# Patient Record
Sex: Female | Born: 1955 | Race: Black or African American | Hispanic: No | Smoking: Never smoker
Health system: Southern US, Community
[De-identification: ages and names within clinical notes are randomized; demographics above are authoritative.]

## PROBLEM LIST (undated history)

## (undated) DIAGNOSIS — K219 Gastro-esophageal reflux disease without esophagitis: Secondary | ICD-10-CM

## (undated) DIAGNOSIS — M1990 Unspecified osteoarthritis, unspecified site: Secondary | ICD-10-CM

## (undated) DIAGNOSIS — I10 Essential (primary) hypertension: Secondary | ICD-10-CM

## (undated) DIAGNOSIS — E785 Hyperlipidemia, unspecified: Secondary | ICD-10-CM

## (undated) HISTORY — PX: POLYPECTOMY: SHX149

## (undated) HISTORY — DX: Hyperlipidemia, unspecified: E78.5

## (undated) HISTORY — PX: ABDOMINAL HYSTERECTOMY: SHX81

## (undated) HISTORY — PX: ROTATOR CUFF REPAIR: SHX139

## (undated) HISTORY — DX: Unspecified osteoarthritis, unspecified site: M19.90

## (undated) HISTORY — PX: COLONOSCOPY: SHX174

## (undated) HISTORY — DX: Gastro-esophageal reflux disease without esophagitis: K21.9

---

## 2000-08-12 ENCOUNTER — Ambulatory Visit (HOSPITAL_COMMUNITY): Admission: RE | Admit: 2000-08-12 | Discharge: 2000-08-12 | Payer: Self-pay | Admitting: Family Medicine

## 2000-08-12 ENCOUNTER — Encounter: Payer: Self-pay | Admitting: Family Medicine

## 2000-09-02 ENCOUNTER — Encounter: Admission: RE | Admit: 2000-09-02 | Discharge: 2000-09-02 | Payer: Self-pay | Admitting: Family Medicine

## 2000-09-02 ENCOUNTER — Encounter: Payer: Self-pay | Admitting: Family Medicine

## 2002-08-22 ENCOUNTER — Encounter: Admission: RE | Admit: 2002-08-22 | Discharge: 2002-08-22 | Payer: Self-pay | Admitting: Family Medicine

## 2002-08-22 ENCOUNTER — Encounter: Payer: Self-pay | Admitting: Family Medicine

## 2004-02-25 ENCOUNTER — Ambulatory Visit: Payer: Self-pay | Admitting: Family Medicine

## 2004-04-17 ENCOUNTER — Ambulatory Visit (HOSPITAL_COMMUNITY): Admission: RE | Admit: 2004-04-17 | Discharge: 2004-04-17 | Payer: Self-pay | Admitting: *Deleted

## 2004-06-10 ENCOUNTER — Ambulatory Visit: Payer: Self-pay | Admitting: Family Medicine

## 2004-06-16 ENCOUNTER — Ambulatory Visit: Payer: Self-pay | Admitting: Family Medicine

## 2004-06-17 ENCOUNTER — Ambulatory Visit: Payer: Self-pay | Admitting: Family Medicine

## 2004-06-24 ENCOUNTER — Ambulatory Visit: Payer: Self-pay | Admitting: Family Medicine

## 2004-07-31 ENCOUNTER — Ambulatory Visit: Payer: Self-pay | Admitting: Gastroenterology

## 2004-10-27 ENCOUNTER — Encounter: Admission: RE | Admit: 2004-10-27 | Discharge: 2004-10-27 | Payer: Self-pay | Admitting: Family Medicine

## 2005-08-13 ENCOUNTER — Ambulatory Visit (HOSPITAL_COMMUNITY): Admission: RE | Admit: 2005-08-13 | Discharge: 2005-08-14 | Payer: Self-pay | Admitting: Orthopedic Surgery

## 2006-06-29 ENCOUNTER — Emergency Department (HOSPITAL_COMMUNITY): Admission: EM | Admit: 2006-06-29 | Discharge: 2006-06-29 | Payer: Self-pay | Admitting: Emergency Medicine

## 2006-09-09 DIAGNOSIS — I10 Essential (primary) hypertension: Secondary | ICD-10-CM

## 2007-01-17 ENCOUNTER — Telehealth (INDEPENDENT_AMBULATORY_CARE_PROVIDER_SITE_OTHER): Payer: Self-pay | Admitting: *Deleted

## 2007-01-20 ENCOUNTER — Telehealth (INDEPENDENT_AMBULATORY_CARE_PROVIDER_SITE_OTHER): Payer: Self-pay | Admitting: *Deleted

## 2007-02-21 ENCOUNTER — Encounter: Admission: RE | Admit: 2007-02-21 | Discharge: 2007-02-21 | Payer: Self-pay | Admitting: Internal Medicine

## 2007-03-23 ENCOUNTER — Ambulatory Visit: Payer: Self-pay | Admitting: Internal Medicine

## 2007-03-24 ENCOUNTER — Ambulatory Visit: Payer: Self-pay | Admitting: *Deleted

## 2007-06-13 ENCOUNTER — Ambulatory Visit: Payer: Self-pay | Admitting: Internal Medicine

## 2007-06-13 ENCOUNTER — Encounter: Payer: Self-pay | Admitting: Family Medicine

## 2007-06-13 LAB — CONVERTED CEMR LAB
ALT: 18 units/L (ref 0–35)
AST: 14 units/L (ref 0–37)
Albumin: 4.2 g/dL (ref 3.5–5.2)
Basophils Absolute: 0 10*3/uL (ref 0.0–0.1)
Basophils Relative: 0 % (ref 0–1)
CO2: 26 meq/L (ref 19–32)
Calcium: 9.6 mg/dL (ref 8.4–10.5)
Chloride: 107 meq/L (ref 96–112)
Cholesterol: 228 mg/dL — ABNORMAL HIGH (ref 0–200)
Hemoglobin: 12.5 g/dL (ref 12.0–15.0)
Lymphocytes Relative: 24 % (ref 12–46)
MCHC: 31.5 g/dL (ref 30.0–36.0)
Monocytes Absolute: 0.3 10*3/uL (ref 0.1–1.0)
Monocytes Relative: 4 % (ref 3–12)
Neutro Abs: 4.8 10*3/uL (ref 1.7–7.7)
Neutrophils Relative %: 69 % (ref 43–77)
Potassium: 3.6 meq/L (ref 3.5–5.3)
RBC: 4.9 M/uL (ref 3.87–5.11)
RDW: 14 % (ref 11.5–15.5)
TSH: 0.343 microintl units/mL — ABNORMAL LOW (ref 0.350–5.50)

## 2007-06-20 ENCOUNTER — Ambulatory Visit: Payer: Self-pay | Admitting: Internal Medicine

## 2007-07-07 ENCOUNTER — Ambulatory Visit: Payer: Self-pay | Admitting: Internal Medicine

## 2007-09-29 ENCOUNTER — Emergency Department (HOSPITAL_COMMUNITY): Admission: EM | Admit: 2007-09-29 | Discharge: 2007-09-29 | Payer: Self-pay | Admitting: Emergency Medicine

## 2007-09-29 ENCOUNTER — Ambulatory Visit: Payer: Self-pay | Admitting: Internal Medicine

## 2007-10-29 ENCOUNTER — Ambulatory Visit (HOSPITAL_COMMUNITY): Admission: RE | Admit: 2007-10-29 | Discharge: 2007-10-29 | Payer: Self-pay | Admitting: Internal Medicine

## 2008-11-21 ENCOUNTER — Ambulatory Visit (HOSPITAL_COMMUNITY): Admission: RE | Admit: 2008-11-21 | Discharge: 2008-11-21 | Payer: Self-pay | Admitting: Family Medicine

## 2009-05-01 ENCOUNTER — Telehealth: Payer: Self-pay | Admitting: Family Medicine

## 2009-05-21 ENCOUNTER — Ambulatory Visit: Payer: Self-pay | Admitting: Family Medicine

## 2009-05-21 DIAGNOSIS — E782 Mixed hyperlipidemia: Secondary | ICD-10-CM

## 2010-02-02 LAB — CONVERTED CEMR LAB
ALT: 29 units/L (ref 0–35)
Albumin: 4 g/dL (ref 3.5–5.2)
BUN: 16 mg/dL (ref 6–23)
Bilirubin Urine: NEGATIVE
Bilirubin, Direct: 0.1 mg/dL (ref 0.0–0.3)
CO2: 32 meq/L (ref 19–32)
Calcium: 9.5 mg/dL (ref 8.4–10.5)
Cholesterol: 279 mg/dL — ABNORMAL HIGH (ref 0–200)
Glucose, Bld: 119 mg/dL — ABNORMAL HIGH (ref 70–99)
Glucose, Urine, Semiquant: NEGATIVE
Ketones, urine, test strip: NEGATIVE
Sodium: 142 meq/L (ref 135–145)
Specific Gravity, Urine: 1.025
Total Protein: 7.2 g/dL (ref 6.0–8.3)
Triglycerides: 343 mg/dL — ABNORMAL HIGH (ref 0.0–149.0)
pH: 6

## 2010-02-06 NOTE — Progress Notes (Signed)
Summary: refill  Phone Note Call from Patient   Summary of Call: patient would like a refill of atenolol.  she has made an appointment for 05/21/09 with dr Tais Koestner. Initial call taken by: Kern Reap CMA Duncan Dull),  May 01, 2009 11:29 AM    Prescriptions: ATENOLOL-CHLORTHALIDONE 50-25 MG  TABS (ATENOLOL-CHLORTHALIDONE) 1 tab qd  #30 x 0   Entered by:   Kern Reap CMA (AAMA)   Authorized by:   Roderick Pee MD   Signed by:   Kern Reap CMA (AAMA) on 05/01/2009   Method used:   Electronically to        [facility] (retail)       [address]       [place], [place]  [address]       Ph: [phone]       Fax: [phone]   RxID:   [id number]   Appended Document: refill sent to wrong pharamcy

## 2010-02-06 NOTE — Assessment & Plan Note (Signed)
Summary: RE-EST PT/RCD   Vital Signs:  Patient profile:   55 year old female Height:      64 inches Weight:      162 pounds BMI:     27.91 Temp:     98.7 degrees F oral BP sitting:   120 / 78  (left arm) Cuff size:   regular  Vitals Entered By: Kern Reap CMA Duncan Dull) (May 21, 2009 1:34 PM) CC: re-establish care Is Patient Diabetic? No   CC:  re-establish care.  History of Present Illness: Alexis Stout is a 55 year old single female, G1, P1, nonsmoker, who comes in today after a 4-year absence to renew her relationship . She has a history of underlying hypertension, treated with Tenoretic 50 /  25 daily.  BP 120/78.  she  has a history of hyperlipidemia.  She was taking simvastatin 20 mg nightly however, since she hasn't been here in 4 years.  She was not able to renew her prescription.  She's otherwise been in excellent health.  She gets routine eye care, and dental care, does BSE monthly.  Last mammogram September 2010.  She's never had a colonoscopy.  No family history of colon cancer.  Last tetanus booster 2000.  We will give her a booster today.  She works in the [employer].  She has no health insurance.    Allergies: No Known Drug Allergies  Past History:  Past medical, surgical, family and social histories (including risk factors) reviewed, and no changes noted (except as noted below).  Past Medical History: Reviewed history from 09/09/2006 and no changes required. Hypertension  Past Surgical History: Reviewed history from 09/09/2006 and no changes required. CB x1 TAH Foot Surgery Hysterectomy  Family History: Reviewed history from 09/09/2006 and no changes required. Family History Diabetes 1st degree relative Family History Hypertension Family History of Cardiovascular disorder Father: Deceased - heart disease Mother: Deceased - ovarian cancer  Social History: Reviewed history from 09/09/2006 and no changes required. Occupation:  Programmer, systems Single Never Smoked Alcohol use-yes Drug use-no  Physical Exam  General:  Well-developed,well-nourished,in no acute distress; alert,appropriate and cooperative throughout examination Head:  Normocephalic and atraumatic without obvious abnormalities. No apparent alopecia or balding. Eyes:  No corneal or conjunctival inflammation noted. EOMI. Perrla. Funduscopic exam benign, without hemorrhages, exudates or papilledema. Vision grossly normal. Ears:  External ear exam shows no significant lesions or deformities.  Otoscopic examination reveals clear canals, tympanic membranes are intact bilaterally without bulging, retraction, inflammation or discharge. Hearing is grossly normal bilaterally. Nose:  External nasal examination shows no deformity or inflammation. Nasal mucosa are pink and moist without lesions or exudates. Mouth:  Oral mucosa and oropharynx without lesions or exudates.  Teeth in good repair. Neck:  No deformities, masses, or tenderness noted. Chest Wall:  No deformities, masses, or tenderness noted. Breasts:  No mass, nodules, thickening, tenderness, bulging, retraction, inflamation, nipple discharge or skin changes noted.   Lungs:  Normal respiratory effort, chest expands symmetrically. Lungs are clear to auscultation, no crackles or wheezes. Heart:  Normal rate and regular rhythm. S1 and S2 normal without gallop, murmur, click, rub or other extra sounds. Abdomen:  Bowel sounds positive,abdomen soft and non-tender without masses, organomegaly or hernias noted. Rectal:  No external abnormalities noted. Normal sphincter tone. No rectal masses or tenderness. Genitalia:  Pelvic Exam:        External: normal female genitalia without lesions or masses        Vagina: normal without lesions or masses  Cervix: normal without lesions or masses        Adnexa: normal bimanual exam without masses or fullness        Uterus: normal by palpation        Pap smear: not  performed Msk:  No deformity or scoliosis noted of thoracic or lumbar spine.   Pulses:  R and L carotid,radial,femoral,dorsalis pedis and posterior tibial pulses are full and equal bilaterally Extremities:  No clubbing, cyanosis, edema, or deformity noted with normal full range of motion of all joints.   Neurologic:  No cranial nerve deficits noted. Station and gait are normal. Plantar reflexes are down-going bilaterally. DTRs are symmetrical throughout. Sensory, motor and coordinative functions appear intact. Skin:  Intact without suspicious lesions or rashes Cervical Nodes:  No lymphadenopathy noted Axillary Nodes:  No palpable lymphadenopathy Inguinal Nodes:  No significant adenopathy Psych:  Cognition and judgment appear intact. Alert and cooperative with normal attention span and concentration. No apparent delusions, illusions, hallucinations   Problems:  Medical Problems Added: 1)  Dx of Hyperlipidemia  (ICD-272.4)  Impression & Recommendations:  Problem # 1:  HYPERTENSION (ICD-401.9) Assessment Improved  The following medications were removed from the medication list:    Tenoretic 50 50-25 Mg Tabs (Atenolol-chlorthalidone) Her updated medication list for this problem includes:    Atenolol-chlorthalidone 50-25 Mg Tabs (Atenolol-chlorthalidone) .Marland Kitchen... 1 tab qd  Orders: Venipuncture (16109) TLB-Lipid Panel (80061-LIPID) TLB-BMP (Basic Metabolic Panel-BMET) (80048-METABOL) TLB-Hemoglobin (Hgb) (85018-HGB) TLB-Hepatic/Liver Function Pnl (80076-HEPATIC) Prescription Created Electronically [phone]) UA Dipstick w/o Micro (automated)  (81003) EKG w/ Interpretation (93000)  Problem # 2:  HYPERLIPIDEMIA (ICD-272.4) Assessment: New  The following medications were removed from the medication list:    Simvastatin 20 Mg Tabs (Simvastatin)    Lipitor 10 Mg Tabs (Atorvastatin calcium)    Zocor 10 Mg Tabs (Simvastatin) Her updated medication list for this problem includes:     Simvastatin 20 Mg Tabs (Simvastatin) .Marland Kitchen... Take one tab by mouth at bedtime  Orders: Venipuncture (09811) TLB-Lipid Panel (80061-LIPID) TLB-BMP (Basic Metabolic Panel-BMET) (80048-METABOL) TLB-Hemoglobin (Hgb) (85018-HGB) TLB-Hepatic/Liver Function Pnl (80076-HEPATIC) Prescription Created Electronically [phone]) UA Dipstick w/o Micro (automated)  (81003) EKG w/ Interpretation (93000)  Complete Medication List: 1)  Atenolol-chlorthalidone 50-25 Mg Tabs (Atenolol-chlorthalidone) .Marland Kitchen.. 1 tab qd 2)  Baby Aspirin 81 Mg Chew (Aspirin) 3)  Simvastatin 20 Mg Tabs (Simvastatin) .... Take one tab by mouth at bedtime 4)  Triamcinolone Acetonide 0.5 % Crea (Triamcinolone acetonide) .... Apply at bedtime  Other Orders: Tdap => 27yrs IM (29562) Admin 1st Vaccine (13086)  Patient Instructions: 1)  continue the Tenoretic 5025 one tablet daily. 2)  Takes Zocor 20 mg nightly along with an 81-mg baby aspirin. 3)  Returned May 2012..........,  annual exam. 4)  Call GI [phone] to see if there is any scholarship money available for a screening colonoscopy Prescriptions: TRIAMCINOLONE ACETONIDE 0.5 % CREA (TRIAMCINOLONE ACETONIDE) apply at bedtime  #60 gr x 2   Entered and Authorized by:   Roderick Pee MD   Signed by:   Roderick Pee MD on 05/21/2009   Method used:   Print then Give to Patient   RxID:   [id number] SIMVASTATIN 20 MG TABS (SIMVASTATIN) take one tab by mouth at bedtime  #100 x 3   Entered and Authorized by:   Roderick Pee MD   Signed by:   Roderick Pee MD on 05/21/2009   Method used:   Print then Give to Patient   RxID:   [id number]  ATENOLOL-CHLORTHALIDONE 50-25 MG  TABS (ATENOLOL-CHLORTHALIDONE) 1 tab qd  #100 x 3   Entered and Authorized by:   Roderick Pee MD   Signed by:   Roderick Pee MD on 05/21/2009   Method used:   Print then Give to Patient   RxID:   [id number]    Immunizations Administered:  Tetanus Vaccine:    Vaccine Type: Tdap    Site:  right deltoid    Mfr: GlaxoSmithKline    Dose: 0.5 ml    Route: IM    Given by: Kern Reap CMA (AAMA)    Exp. Date: 03/30/2011    Lot #: [id number]    Physician counseled: yes   Laboratory Results   Urine Tests    Routine Urinalysis   Color: yellow Appearance: Clear Glucose: negative   (Normal Range: Negative) Bilirubin: negative   (Normal Range: Negative) Ketone: negative   (Normal Range: Negative) Spec. Gravity: 1.025   (Normal Range: 1.003-1.035) Blood: trace-lysed   (Normal Range: Negative) pH: 6.0   (Normal Range: 5.0-8.0) Protein: negative   (Normal Range: Negative) Urobilinogen: 0.2   (Normal Range: 0-1) Nitrite: negative   (Normal Range: Negative) Leukocyte Esterace: trace   (Normal Range: Negative)    Comments: Rita Ohara  May 21, 2009 3:29 PM

## 2010-05-14 ENCOUNTER — Other Ambulatory Visit: Payer: Self-pay | Admitting: Family Medicine

## 2010-05-14 DIAGNOSIS — Z1231 Encounter for screening mammogram for malignant neoplasm of breast: Secondary | ICD-10-CM

## 2010-05-22 ENCOUNTER — Ambulatory Visit (HOSPITAL_COMMUNITY)
Admission: RE | Admit: 2010-05-22 | Discharge: 2010-05-22 | Disposition: A | Discharge status: Home / Self Care | Payer: Self-pay | Source: Ambulatory Visit | Attending: Family Medicine | Admitting: Family Medicine

## 2010-05-22 DIAGNOSIS — Z1231 Encounter for screening mammogram for malignant neoplasm of breast: Secondary | ICD-10-CM

## 2010-05-23 NOTE — Op Note (Signed)
Alexis Stout, Alexis Stout               ACCOUNT NO.:  [id number]   MEDICAL RECORD NO.:  [id number]          PATIENT TYPE:  AMB   LOCATION:  DAY                          FACILITY:  [facility]   PHYSICIAN:  Marlowe Kays, M.D.  DATE OF BIRTH:  [birth year]   DATE OF PROCEDURE:  08/13/2005  DATE OF DISCHARGE:                                 OPERATIVE REPORT   PREOPERATIVE DIAGNOSES:  1. Possible posterior labral tear.  2. 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   POSTOPERATIVE DIAGNOSES:  1. No labral tear seen.  2. 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   OPERATION:  Right shoulder arthroscopy (essentially normal examination and  arthroscopic subacromial decompression, right shoulder).   SURGEON:  Dr. Simonne Come.   ASSISTANT:  Mr. Idolina Primer P.A.-C.   ANESTHESIA:  General proceeded by interscalene block pathology.   PATHOLOGY AND INDICATION FOR PROCEDURE:  Painful right shoulder with A MRI  demonstrating diagnoses 1 and 2 in preop diagnosis.   PROCEDURE:  Satisfactory general anesthesia after the interscalene block,  beach-chair position on the [place] frame.  Right shoulder girdle was prepped  with DuraPrep, draped in a sterile field.  Anatomy of the shoulder joint was  marked out, and posterior soft spot portal, lateral portal and subacromial  space were all infiltrated with 1/2% Marcaine with adrenalin.  Through the  posterior soft spot portal, I atraumatically entered the glenohumeral joint.  On inspection, it was basically normal, some minimal fraying of the labrum  which did not require shaving.  I then redirected the scope in the  subacromial space, and through a lateral portal, I introduced the 4.2  shaver.  She had a marked subdeltoid bursitis which I began clearing out  with the shaver.  I then switched to the a 90-degree ArthroCare vaporizer  and began removing the soft tissue from the  undersurface of the acromion.  Early on in this process, I noted what appeared be either an abrasion or a  third degree burn inferior to the lateral portal.  This was addressed  subsequently as noted below.  Following removal of most of the soft tissue  and outlining the anterior bone formation which was quite prominent, I then  used a 4-mm oval bur and began burring down the undersurface of the  acromion.  I went back-and-forth between the 4.2 shaver, the ArthroCare  vaporizer and the bur until we had a wide decompression as documented with  pictures with the arm to side and the arm abducted.  There was no unusual  bleeding at the conclusion of the case.  We evacuated all fluid possible.  I  excised the area of abrasion/burn which measured 8 mm in width and 1 cm in  length, incorporating it into the lateral portal.  I infiltrated the two  portals and subacromial space once again with 1/2% Marcaine with adrenalin.  The posterior portal was closed with 4-0 nylon.  The lateral extended portal closed with 3-0 Vicryl subcutaneous tissue and 4-  0 nylon skin as  well.  Betadine, Adaptic dry dressing and shoulder  immobilizer applied.  She tolerated the procedure well and was taken to  recovery in satisfactory condition with no known complications other than  the abrasion/burn as described.           ______________________________  Marlowe Kays, M.D.     JA/MEDQ  D:  08/13/2005  T:  08/13/2005  Job:  [id number]

## 2010-09-26 ENCOUNTER — Other Ambulatory Visit: Payer: Self-pay | Admitting: Family Medicine

## 2011-01-12 ENCOUNTER — Ambulatory Visit (HOSPITAL_COMMUNITY)
Admission: RE | Admit: 2011-01-12 | Discharge: 2011-01-12 | Disposition: A | Discharge status: Home / Self Care | Payer: Self-pay | Source: Ambulatory Visit | Attending: Orthopaedic Surgery | Admitting: Orthopaedic Surgery

## 2011-01-12 DIAGNOSIS — M79609 Pain in unspecified limb: Secondary | ICD-10-CM | POA: Insufficient documentation

## 2011-01-12 DIAGNOSIS — M7989 Other specified soft tissue disorders: Secondary | ICD-10-CM

## 2011-01-12 DIAGNOSIS — R609 Edema, unspecified: Secondary | ICD-10-CM

## 2011-01-12 NOTE — Progress Notes (Signed)
Left lower extremity venous duplex completed.  Preliminary report is negative for DVT, SVT, or a Baker's cyst. Smiley Houseman 01/12/2011, 3:10 PM

## 2011-03-11 ENCOUNTER — Other Ambulatory Visit: Payer: Self-pay | Admitting: Family Medicine

## 2011-10-09 ENCOUNTER — Other Ambulatory Visit: Payer: Self-pay | Admitting: Family Medicine

## 2011-10-09 DIAGNOSIS — Z1231 Encounter for screening mammogram for malignant neoplasm of breast: Secondary | ICD-10-CM

## 2011-10-16 ENCOUNTER — Other Ambulatory Visit: Payer: Self-pay | Admitting: Family Medicine

## 2011-10-28 ENCOUNTER — Ambulatory Visit (HOSPITAL_COMMUNITY)
Admission: RE | Admit: 2011-10-28 | Discharge: 2011-10-28 | Disposition: A | Discharge status: Home / Self Care | Payer: Self-pay | Source: Ambulatory Visit | Attending: Family Medicine | Admitting: Family Medicine

## 2011-10-28 DIAGNOSIS — Z1231 Encounter for screening mammogram for malignant neoplasm of breast: Secondary | ICD-10-CM

## 2011-11-24 ENCOUNTER — Ambulatory Visit (INDEPENDENT_AMBULATORY_CARE_PROVIDER_SITE_OTHER): Payer: Self-pay | Admitting: Family Medicine

## 2011-11-24 ENCOUNTER — Encounter: Payer: Self-pay | Admitting: Family Medicine

## 2011-11-24 VITALS — BP 130/80 | Temp 98.0°F | Ht 64.0 in | Wt 163.0 lb

## 2011-11-24 DIAGNOSIS — I10 Essential (primary) hypertension: Secondary | ICD-10-CM

## 2011-11-24 NOTE — Progress Notes (Signed)
  Subjective:    Patient ID: Alexis Stout, female    DOB: 28-Jun-1955, 56 y.o.   MRN: [id number]  HPI Alexis Stout is a 56 year old married female nonsmoker who comes in today for followup of hypertension  We started on Tenoretic 50/25 BP today 130/80 pulse 70 and regular no side effects from medication  She also recently had a mammogram which was normal.  She needs to have a physical examination get set up for colonoscopy etc. however she has no insurance   Review of Systems General and cardiovascular review of systems otherwise negative    Objective:   Physical Exam Well-developed well-nourished female in no acute distress BP right arm sitting position 130/80 pulse 70 and regular       Assessment & Plan:  Hypertension at goal continue current therapy  Return for CPX ASAP

## 2011-11-24 NOTE — Patient Instructions (Signed)
Continue blood pressure medication one tablet daily  Return for CPX ASAP

## 2011-12-18 ENCOUNTER — Other Ambulatory Visit: Payer: Self-pay | Admitting: Internal Medicine

## 2012-02-01 ENCOUNTER — Other Ambulatory Visit: Payer: Self-pay | Admitting: Internal Medicine

## 2012-02-04 ENCOUNTER — Other Ambulatory Visit: Payer: Self-pay | Admitting: Family Medicine

## 2012-05-29 ENCOUNTER — Emergency Department (HOSPITAL_COMMUNITY)
Admission: EM | Admit: 2012-05-29 | Discharge: 2012-05-29 | Disposition: A | Discharge status: Home / Self Care | Payer: Self-pay | Attending: Emergency Medicine | Admitting: Emergency Medicine

## 2012-05-29 ENCOUNTER — Emergency Department (HOSPITAL_COMMUNITY): Payer: Self-pay

## 2012-05-29 ENCOUNTER — Encounter (HOSPITAL_COMMUNITY): Payer: Self-pay | Admitting: Emergency Medicine

## 2012-05-29 DIAGNOSIS — I10 Essential (primary) hypertension: Secondary | ICD-10-CM | POA: Insufficient documentation

## 2012-05-29 DIAGNOSIS — R51 Headache: Secondary | ICD-10-CM

## 2012-05-29 DIAGNOSIS — Z79899 Other long term (current) drug therapy: Secondary | ICD-10-CM | POA: Insufficient documentation

## 2012-05-29 HISTORY — DX: Essential (primary) hypertension: I10

## 2012-05-29 LAB — URINALYSIS, ROUTINE W REFLEX MICROSCOPIC
Bilirubin Urine: NEGATIVE
Glucose, UA: NEGATIVE mg/dL
Specific Gravity, Urine: 1.019 (ref 1.005–1.030)
pH: 5.5 (ref 5.0–8.0)

## 2012-05-29 LAB — CBC WITH DIFFERENTIAL/PLATELET
Basophils Absolute: 0 10*3/uL (ref 0.0–0.1)
Eosinophils Absolute: 0.2 10*3/uL (ref 0.0–0.7)
Eosinophils Relative: 2 % (ref 0–5)
Lymphocytes Relative: 17 % (ref 12–46)
MCH: 25.7 pg — ABNORMAL LOW (ref 26.0–34.0)
MCV: 76.8 fL — ABNORMAL LOW (ref 78.0–100.0)
Platelets: 252 10*3/uL (ref 150–400)
RDW: 12.8 % (ref 11.5–15.5)
WBC: 9.9 10*3/uL (ref 4.0–10.5)

## 2012-05-29 LAB — COMPREHENSIVE METABOLIC PANEL
ALT: 14 U/L (ref 0–35)
AST: 12 U/L (ref 0–37)
Calcium: 9.5 mg/dL (ref 8.4–10.5)
Sodium: 139 mEq/L (ref 135–145)
Total Protein: 7.8 g/dL (ref 6.0–8.3)

## 2012-05-29 MED ORDER — POTASSIUM CHLORIDE CRYS ER 20 MEQ PO TBCR
40.0000 meq | EXTENDED_RELEASE_TABLET | Freq: Once | ORAL | Status: AC
  Administered 2012-05-29: 40 meq via ORAL
  Filled 2012-05-29: qty 2

## 2012-05-29 MED ORDER — HYDROCODONE-ACETAMINOPHEN 5-325 MG PO TABS
1.0000 | ORAL_TABLET | Freq: Once | ORAL | Status: AC
  Administered 2012-05-29: 1 via ORAL
  Filled 2012-05-29: qty 1

## 2012-05-29 MED ORDER — AMOXICILLIN 500 MG PO CAPS: 500.0000 mg | ORAL_CAPSULE | Freq: Three times a day (TID) | ORAL | Status: DC

## 2012-05-29 MED ORDER — TRAMADOL HCL 50 MG PO TABS: 50.0000 mg | ORAL_TABLET | Freq: Four times a day (QID) | ORAL | Status: DC | PRN

## 2012-05-29 NOTE — ED Notes (Signed)
Dr. Estell Harpin in to speak w/ pt regarding pt's concern about not having a CT ordered. Order placed per Dr. Estell Harpin

## 2012-05-29 NOTE — ED Provider Notes (Addendum)
History     CSN: [id number]  Arrival date & time 05/29/12  0906   First MD Initiated Contact with Patient 05/29/12 [phone]      Chief Complaint  Patient presents with  . Headache    (Consider location/radiation/quality/duration/timing/severity/associated sxs/prior treatment) Patient is a 57 y.o. female presenting with headaches. The history is provided by the patient (the pt complains of a mild headache). No language interpreter was used.  Headache Pain location:  Frontal Quality:  Dull Radiates to:  Does not radiate Severity currently:  4/10 Severity at highest:  6/10 Onset quality:  Gradual Timing:  Intermittent Progression:  Waxing and waning Chronicity:  New Associated symptoms: no abdominal pain, no back pain, no congestion, no cough, no diarrhea, no fatigue, no seizures and no sinus pressure     Past Medical History  Diagnosis Date  . Hypertension     Past Surgical History  Procedure Laterality Date  . Abdominal hysterectomy      No family history on file.  History  Substance Use Topics  . Smoking status: Never Smoker   . Smokeless tobacco: Never Used  . Alcohol Use: 0.0 oz/week    1-2 Glasses of wine per week     Comment: 1-2 glasses every after noon    OB History   Grav Para Term Preterm Abortions TAB SAB Ect Mult Living                  Review of Systems  Constitutional: Negative for appetite change and fatigue.  HENT: Negative for congestion, sinus pressure and ear discharge.   Eyes: Negative for discharge.  Respiratory: Negative for cough.   Cardiovascular: Negative for chest pain.  Gastrointestinal: Negative for abdominal pain and diarrhea.  Genitourinary: Negative for frequency and hematuria.  Musculoskeletal: Negative for back pain.  Skin: Negative for rash.  Neurological: Positive for headaches. Negative for seizures.  Psychiatric/Behavioral: Negative for hallucinations.    Allergies  Review of patient's allergies indicates no known  allergies.  Home Medications   Current Outpatient Rx  Name  Route  Sig  Dispense  Refill  . atenolol-chlorthalidone (TENORETIC) 50-25 MG per tablet      TAKE ONE TABLET BY MOUTH EVERY DAY. MUST BE SEEN BEFORE ADDITONAL REFILLS   30 tablet   0   . ibuprofen (ADVIL,MOTRIN) 200 MG tablet   Oral   Take 400 mg by mouth every 6 (six) hours as needed for pain.         . traMADol (ULTRAM) 50 MG tablet   Oral   Take 1 tablet (50 mg total) by mouth every 6 (six) hours as needed for pain.   15 tablet   0     BP 134/80  Pulse 67  Temp(Src) 98.2 F (36.8 C) (Oral)  Resp 20  SpO2 99%  Physical Exam  Constitutional: She is oriented to person, place, and time. She appears well-developed.  HENT:  Head: Normocephalic.  Eyes: Conjunctivae and EOM are normal. No scleral icterus.  Neck: Neck supple. No thyromegaly present.  Cardiovascular: Normal rate and regular rhythm.  Exam reveals no gallop and no friction rub.   No murmur heard. Pulmonary/Chest: No stridor. She has no wheezes. She has no rales. She exhibits no tenderness.  Abdominal: She exhibits no distension. There is no tenderness. There is no rebound.  Musculoskeletal: Normal range of motion. She exhibits no edema.  Lymphadenopathy:    She has no cervical adenopathy.  Neurological: She is oriented to person, place,  and time. Coordination normal.  Skin: No rash noted. No erythema.  Psychiatric: She has a normal mood and affect. Her behavior is normal.    ED Course  Procedures (including critical care time)  Labs Reviewed  CBC WITH DIFFERENTIAL - Abnormal; Notable for the following:    MCV 76.8 (*)    MCH 25.7 (*)    All other components within normal limits  COMPREHENSIVE METABOLIC PANEL - Abnormal; Notable for the following:    Potassium 3.1 (*)    Glucose, Bld 113 (*)    Alkaline Phosphatase 119 (*)    GFR calc non Af Amer 74 (*)    GFR calc Af Amer 86 (*)    All other components within normal limits   URINALYSIS, ROUTINE W REFLEX MICROSCOPIC - Abnormal; Notable for the following:    Hgb urine dipstick SMALL (*)    Leukocytes, UA TRACE (*)    All other components within normal limits  URINE MICROSCOPIC-ADD ON   No results found.   1. Headache       MDM  Headache tension,  Possible sinusitis,  Will tx with amox        Benny Lennert, MD 05/29/12 1132  Benny Lennert, MD 05/29/12 1251

## 2012-05-29 NOTE — ED Notes (Signed)
Pt presents with frontal headache, causing pressure behind eyes, pain is increased when pt is chewing food. When pt clear her throat it makes it hurt worse. Denies ear pain or sore throat. Pt also endorses dizziness with movement especially if she needs to bend over. Has taken some Ibuprofen with only some relief.  Pt does states on Thursday afternoon she had episode when she was unable to void.

## 2012-05-29 NOTE — ED Notes (Signed)
Discharge instructions reviewed w/ pt., verbalizes understanding. Two prescriptions provided at discharge. 

## 2012-05-29 NOTE — ED Notes (Signed)
Pt aware of the need for a urine sample. 

## 2012-05-31 ENCOUNTER — Encounter (HOSPITAL_COMMUNITY): Payer: Self-pay | Admitting: Emergency Medicine

## 2012-05-31 ENCOUNTER — Emergency Department (HOSPITAL_COMMUNITY)
Admission: EM | Admit: 2012-05-31 | Discharge: 2012-05-31 | Disposition: A | Discharge status: Home / Self Care | Payer: Self-pay | Attending: Emergency Medicine | Admitting: Emergency Medicine

## 2012-05-31 DIAGNOSIS — I10 Essential (primary) hypertension: Secondary | ICD-10-CM | POA: Insufficient documentation

## 2012-05-31 DIAGNOSIS — R51 Headache: Secondary | ICD-10-CM | POA: Insufficient documentation

## 2012-05-31 MED ORDER — KETOROLAC TROMETHAMINE 30 MG/ML IJ SOLN
30.0000 mg | Freq: Once | INTRAMUSCULAR | Status: AC
  Administered 2012-05-31: 30 mg via INTRAVENOUS
  Filled 2012-05-31: qty 1

## 2012-05-31 MED ORDER — SODIUM CHLORIDE 0.9 % IV SOLN
INTRAVENOUS | Status: DC
  Administered 2012-05-31: 21:00:00 via INTRAVENOUS

## 2012-05-31 MED ORDER — DEXAMETHASONE SODIUM PHOSPHATE 10 MG/ML IJ SOLN
10.0000 mg | Freq: Once | INTRAMUSCULAR | Status: AC
  Administered 2012-05-31: 10 mg via INTRAVENOUS
  Filled 2012-05-31: qty 1

## 2012-05-31 MED ORDER — NAPROXEN 500 MG PO TABS: 500.0000 mg | ORAL_TABLET | Freq: Two times a day (BID) | ORAL | Status: DC

## 2012-05-31 MED ORDER — DIPHENHYDRAMINE HCL 50 MG/ML IJ SOLN
25.0000 mg | Freq: Once | INTRAMUSCULAR | Status: AC
  Administered 2012-05-31: 25 mg via INTRAVENOUS
  Filled 2012-05-31: qty 1

## 2012-05-31 MED ORDER — METOCLOPRAMIDE HCL 5 MG/ML IJ SOLN
10.0000 mg | Freq: Once | INTRAMUSCULAR | Status: AC
  Administered 2012-05-31: 10 mg via INTRAVENOUS
  Filled 2012-05-31: qty 2

## 2012-05-31 NOTE — ED Provider Notes (Signed)
History     CSN: [id number]  Arrival date & time 05/31/12  1811   First MD Initiated Contact with Patient 05/31/12 2007      Chief Complaint  Patient presents with  . Headache    (Consider location/radiation/quality/duration/timing/severity/associated sxs/prior treatment) HPI Alexis Stout is a 57 y.o. female with a history of hypertension presents emergency department complaining of headache.  Patient was seen on Sunday for same thing and was treated for possible sinus headache with tramadol and amoxicillin.  At that time patient had a CT head that was negative for acute infiltrate.  Patient states headache onset began Thursday and is described as a throbbing sensation in the frontal lobe area.  Patient denies any vision changes, vomiting, head trauma, ataxia, weakness or slurred speech.  Past Medical History  Diagnosis Date  . Hypertension     Past Surgical History  Procedure Laterality Date  . Abdominal hysterectomy      No family history on file.  History  Substance Use Topics  . Smoking status: Never Smoker   . Smokeless tobacco: Never Used  . Alcohol Use: 0.0 oz/week    1-2 Glasses of wine per week     Comment: 1-2 glasses every after noon    OB History   Grav Para Term Preterm Abortions TAB SAB Ect Mult Living                  Review of Systems Ten systems reviewed and are negative for acute change, except as noted in the HPI.    Allergies  Review of patient's allergies indicates no known allergies.  Home Medications   Current Outpatient Rx  Name  Route  Sig  Dispense  Refill  . amoxicillin (AMOXIL) 500 MG capsule   Oral   Take 500 mg by mouth 3 (three) times daily.         Marland Kitchen atenolol-chlorthalidone (TENORETIC) 50-25 MG per tablet   Oral   Take 1 tablet by mouth daily.         . traMADol (ULTRAM) 50 MG tablet   Oral   Take 50 mg by mouth every 6 (six) hours as needed for pain.         . naproxen (NAPROSYN) 500 MG tablet   Oral    Take 1 tablet (500 mg total) by mouth 2 (two) times daily.   30 tablet   0     BP 126/74  Pulse 73  Temp(Src) 99.2 F (37.3 C) (Oral)  SpO2 99%  Physical Exam  Nursing note and vitals reviewed. Constitutional: She is oriented to person, place, and time. She appears well-developed and well-nourished. She appears distressed.  HENT:  Head: Normocephalic and atraumatic.  Eyes: Conjunctivae and EOM are normal. Pupils are equal, round, and reactive to light. No scleral icterus.  Neck: Normal range of motion.  Neck supple with no nuchal rigidity, full pain free ROM. No carotid bruit  Cardiovascular: Normal rate, regular rhythm, normal heart sounds and intact distal pulses.   Pulmonary/Chest: Effort normal and breath sounds normal. No respiratory distress.  Musculoskeletal: Normal range of motion.  Neurological: She is alert and oriented to person, place, and time. She has normal strength.  CN III-XII intact, good coordination, normal gait, strength 5/5 bilaterally, intact distal sensation.  Skin: Skin is warm and dry.  No rash, non diaphoretic    ED Course  Procedures (including critical care time)  Labs Reviewed - No data to display No results found.  Medications  ketorolac (TORADOL) 30 MG/ML injection 30 mg (30 mg Intravenous Given 05/31/12 2036)  metoCLOPramide (REGLAN) injection 10 mg (10 mg Intravenous Given 05/31/12 2034)  diphenhydrAMINE (BENADRYL) injection 25 mg (25 mg Intravenous Given 05/31/12 2035)  dexamethasone (DECADRON) injection 10 mg (10 mg Intravenous Given 05/31/12 2118)     1. HA (headache)       MDM  HA Pt HA treated and improved while in ED.  Presentation is like pts typical HA and non concerning for [facility], ICH, Meningitis, or temporal arteritis. Pt is afebrile with no focal neuro deficits, nuchal rigidity, or change in vision. Pt is to follow up with PCP to discuss prophylactic medication. Pt verbalizes understanding and is agreeable with plan to dc.           Jaci Carrel, [place] 06/01/12 [phone]

## 2012-05-31 NOTE — ED Notes (Signed)
Pt c/o headache since Sunday. Pt states she was seen in ED on Sunday for the same thing, but is still having a headache. Pt denies n/v. States she is taking Tramadol and amoxicillin. States Tramadol not helping for pain. Pt ambulatory in triage with steady gait. Pt arrives with family member.

## 2012-06-02 NOTE — ED Provider Notes (Signed)
Medical screening examination/treatment/procedure(s) were performed by non-physician practitioner and as supervising physician I was immediately available for consultation/collaboration.   Loren Racer, MD 06/02/12 [phone]

## 2012-11-17 ENCOUNTER — Other Ambulatory Visit: Payer: Self-pay | Admitting: Family Medicine

## 2012-11-17 DIAGNOSIS — Z1231 Encounter for screening mammogram for malignant neoplasm of breast: Secondary | ICD-10-CM

## 2012-12-07 ENCOUNTER — Ambulatory Visit (HOSPITAL_COMMUNITY): Payer: Self-pay | Attending: Family Medicine

## 2012-12-28 ENCOUNTER — Ambulatory Visit (HOSPITAL_COMMUNITY)
Admission: RE | Admit: 2012-12-28 | Discharge: 2012-12-28 | Disposition: A | Discharge status: Home / Self Care | Payer: Self-pay | Source: Ambulatory Visit | Attending: Family Medicine | Admitting: Family Medicine

## 2012-12-28 DIAGNOSIS — Z1231 Encounter for screening mammogram for malignant neoplasm of breast: Secondary | ICD-10-CM

## 2013-01-14 ENCOUNTER — Emergency Department (HOSPITAL_COMMUNITY)
Admission: EM | Admit: 2013-01-14 | Discharge: 2013-01-14 | Disposition: A | Discharge status: Home / Self Care | Payer: No Typology Code available for payment source | Attending: Emergency Medicine | Admitting: Emergency Medicine

## 2013-01-14 ENCOUNTER — Encounter (HOSPITAL_COMMUNITY): Payer: Self-pay | Admitting: Emergency Medicine

## 2013-01-14 DIAGNOSIS — Z79899 Other long term (current) drug therapy: Secondary | ICD-10-CM | POA: Insufficient documentation

## 2013-01-14 DIAGNOSIS — R609 Edema, unspecified: Secondary | ICD-10-CM | POA: Insufficient documentation

## 2013-01-14 DIAGNOSIS — M25562 Pain in left knee: Secondary | ICD-10-CM

## 2013-01-14 DIAGNOSIS — M25569 Pain in unspecified knee: Secondary | ICD-10-CM | POA: Insufficient documentation

## 2013-01-14 DIAGNOSIS — I10 Essential (primary) hypertension: Secondary | ICD-10-CM | POA: Insufficient documentation

## 2013-01-14 MED ORDER — OXYCODONE-ACETAMINOPHEN 5-325 MG PO TABS: 1.0000 | ORAL_TABLET | ORAL | Status: DC | PRN

## 2013-01-14 NOTE — ED Notes (Signed)
States that she began having minor left knee pain yesterday accompanied by right hip pain that is worse this am

## 2013-01-14 NOTE — Discharge Instructions (Signed)
Take the prescribed medication as directed.  Do not drive while taking this medication. Follow-up with orthopedics if no improvement of symptoms within the next. Return to the ED for new or worsening symptoms.

## 2013-01-14 NOTE — ED Notes (Signed)
Lisa, PA at bedside

## 2013-01-14 NOTE — ED Provider Notes (Signed)
CSN: [id number]     Arrival date & time 01/14/13  0815 History   First MD Initiated Contact with Patient 01/14/13 [phone]     Chief Complaint  Patient presents with  . Knee Pain   (Consider location/radiation/quality/duration/timing/severity/associated sxs/prior Treatment) The history is provided by the patient and medical records.   This is a 58 year old female with past medical history significant for hypertension, presenting to the ED for intermittent left knee pain over the past several days. States pain initially started 3 days ago and upon waking, but improved throughout the day. Her pain has waxed and waned several times since then but has been constant this morning. Patient denies any recent injury, trauma, or falls. No prior left knee surgery. Denies any numbness or paresthesias of lower extremities. Patient has taken over-the-counter Motrin and Tylenol as well as wrapping her knee with an Ace wrap without noted improvement.  Pt states she has been limping, favoring her left knee which she thinks is now causing her right hip to hurt.  Pt states she is concerned because her friend who is a nurse told her it might be a DVT.  Pt has no prior hx of DVT or PE.  Past Medical History  Diagnosis Date  . Hypertension    Past Surgical History  Procedure Laterality Date  . Abdominal hysterectomy     No family history on file. History  Substance Use Topics  . Smoking status: Never Smoker   . Smokeless tobacco: Never Used  . Alcohol Use: 0.0 oz/week    1-2 Glasses of wine per week     Comment: 1-2 glasses every after noon   OB History   Grav Para Term Preterm Abortions TAB SAB Ect Mult Living                 Review of Systems  Musculoskeletal: Positive for arthralgias.  All other systems reviewed and are negative.    Allergies  Review of patient's allergies indicates no known allergies.  Home Medications   Current Outpatient Rx  Name  Route  Sig  Dispense  Refill  .  acetaminophen (TYLENOL) 500 MG tablet   Oral   Take 1,000 mg by mouth every 6 (six) hours as needed for moderate pain.         Marland Kitchen atenolol-chlorthalidone (TENORETIC) 50-25 MG per tablet   Oral   Take 1 tablet by mouth daily.         . traMADol (ULTRAM) 50 MG tablet   Oral   Take 50 mg by mouth every 6 (six) hours as needed for pain.          BP 136/77  Pulse 90  Temp(Src) 97.5 F (36.4 C) (Oral)  Resp 16  SpO2 99%  Physical Exam  Nursing note and vitals reviewed. Constitutional: She is oriented to person, place, and time. She appears well-developed and well-nourished. No distress.  HENT:  Head: Normocephalic and atraumatic.  Mouth/Throat: Oropharynx is clear and moist.  Eyes: Conjunctivae and EOM are normal. Pupils are equal, round, and reactive to light.  Neck: Normal range of motion. Neck supple.  Cardiovascular: Normal rate, regular rhythm and normal heart sounds.   Pulmonary/Chest: Effort normal and breath sounds normal. No respiratory distress. She has no wheezes.  Musculoskeletal: Normal range of motion.       Right hip: Normal.       Left knee: She exhibits swelling. She exhibits normal range of motion, no effusion, no ecchymosis, no deformity, no  laceration, no erythema, normal alignment and no LCL laxity. Tenderness found. Medial joint line tenderness noted.  Left knee with TTP along medial joint line; mild swelling but no bruising or deformity; full ROM maintained with some crepitus noted; no calf asymmetry, erythema, or palpable cord; negative Homan's sign Right hip with full ROM; no visible injuries or deformities  Neurological: She is alert and oriented to person, place, and time.  Skin: Skin is warm and dry. She is not diaphoretic.  Psychiatric: She has a normal mood and affect.    ED Course  Procedures (including critical care time) Labs Review Labs Reviewed - No data to display Imaging Review No results found.  EKG Interpretation   None        MDM   1. Knee pain, acute, left    Atraumatic left knee pain.  Sx and PE consistent with osteoarthritis, doubt DVT as pt has no calf pain, asymmetry, erythema, or TTP.  Do not feel that imaging will change plan of care.  Pt placed in knee sleeve.  Rx percocet.  FU with orthopedics if no improvement.  Discussed plan with pt, they agreed.  Return precautions advised.  Larene Pickett, PA-C 01/14/13 1105

## 2013-01-14 NOTE — ED Notes (Signed)
Pt escorted to discharge window. Verbalized understanding discharge instructions. In no acute distress.   

## 2013-01-14 NOTE — ED Provider Notes (Signed)
Medical screening examination/treatment/procedure(s) were performed by non-physician practitioner and as supervising physician I was immediately available for consultation/collaboration.  EKG Interpretation   None         Blanchard Kelch, MD 01/14/13 1601

## 2013-07-03 ENCOUNTER — Emergency Department (HOSPITAL_COMMUNITY)
Admission: EM | Admit: 2013-07-03 | Discharge: 2013-07-03 | Disposition: A | Discharge status: Home / Self Care | Payer: No Typology Code available for payment source | Source: Home / Self Care | Attending: Family Medicine | Admitting: Family Medicine

## 2013-07-03 ENCOUNTER — Other Ambulatory Visit (HOSPITAL_COMMUNITY)
Admission: RE | Admit: 2013-07-03 | Discharge: 2013-07-03 | Disposition: A | Discharge status: Home / Self Care | Payer: No Typology Code available for payment source | Source: Ambulatory Visit | Attending: Family Medicine | Admitting: Family Medicine

## 2013-07-03 ENCOUNTER — Encounter (HOSPITAL_COMMUNITY): Payer: Self-pay | Admitting: Emergency Medicine

## 2013-07-03 ENCOUNTER — Ambulatory Visit (HOSPITAL_COMMUNITY): Payer: No Typology Code available for payment source | Attending: Family Medicine

## 2013-07-03 DIAGNOSIS — R1032 Left lower quadrant pain: Secondary | ICD-10-CM | POA: Insufficient documentation

## 2013-07-03 DIAGNOSIS — Z90710 Acquired absence of both cervix and uterus: Secondary | ICD-10-CM | POA: Insufficient documentation

## 2013-07-03 DIAGNOSIS — N760 Acute vaginitis: Secondary | ICD-10-CM | POA: Insufficient documentation

## 2013-07-03 DIAGNOSIS — Z113 Encounter for screening for infections with a predominantly sexual mode of transmission: Secondary | ICD-10-CM | POA: Insufficient documentation

## 2013-07-03 DIAGNOSIS — R109 Unspecified abdominal pain: Secondary | ICD-10-CM

## 2013-07-03 LAB — COMPREHENSIVE METABOLIC PANEL
ALBUMIN: 3.8 g/dL (ref 3.5–5.2)
ALK PHOS: 99 U/L (ref 39–117)
ALT: 18 U/L (ref 0–35)
AST: 13 U/L (ref 0–37)
BUN: 12 mg/dL (ref 6–23)
CALCIUM: 9.5 mg/dL (ref 8.4–10.5)
CO2: 28 mEq/L (ref 19–32)
Chloride: 100 mEq/L (ref 96–112)
Creatinine, Ser: 0.77 mg/dL (ref 0.50–1.10)
GFR calc Af Amer: >90 mL/min (ref >90)
GFR calc non Af Amer: >90 mL/min (ref >90)
Glucose, Bld: 91 mg/dL (ref 70–99)
POTASSIUM: 3.7 meq/L (ref 3.7–5.3)
SODIUM: 141 meq/L (ref 137–147)
TOTAL PROTEIN: 7.7 g/dL (ref 6.0–8.3)
Total Bilirubin: 0.2 mg/dL — ABNORMAL LOW (ref 0.3–1.2)

## 2013-07-03 LAB — CBC
HCT: 38.8 % (ref 36.0–46.0)
Hemoglobin: 12.7 g/dL (ref 12.0–15.0)
MCH: 25.6 pg — ABNORMAL LOW (ref 26.0–34.0)
MCHC: 32.7 g/dL (ref 30.0–36.0)
MCV: 78.1 fL (ref 78.0–100.0)
Platelets: 219 10*3/uL (ref 150–400)
RBC: 4.97 MIL/uL (ref 3.87–5.11)
RDW: 13.2 % (ref 11.5–15.5)
WBC: 7.7 10*3/uL (ref 4.0–10.5)

## 2013-07-03 LAB — POCT URINALYSIS DIP (DEVICE)
BILIRUBIN URINE: NEGATIVE
GLUCOSE, UA: NEGATIVE mg/dL
KETONES UR: NEGATIVE mg/dL
LEUKOCYTES UA: NEGATIVE
NITRITE: NEGATIVE
PH: 5 (ref 5.0–8.0)
PROTEIN: NEGATIVE mg/dL
SPECIFIC GRAVITY, URINE: 1.02 (ref 1.005–1.030)
Urobilinogen, UA: 0.2 mg/dL (ref 0.0–1.0)

## 2013-07-03 MED ORDER — CEPHALEXIN 500 MG PO CAPS: 500.0000 mg | ORAL_CAPSULE | Freq: Three times a day (TID) | ORAL | Status: DC

## 2013-07-03 MED ORDER — TRAMADOL HCL 50 MG PO TABS: 50.0000 mg | ORAL_TABLET | Freq: Four times a day (QID) | ORAL | Status: DC | PRN

## 2013-07-03 NOTE — ED Provider Notes (Signed)
Alexis Stout is a 58 y.o. female who presents to Urgent Care today for left lower corner and abdominal/pelvic pain. Symptoms present for 6 days without injury. No fevers chills nausea vomiting or diarrhea. Patient notes mild urinary frequency. She denies any urgency or burning with pain. The pain is worse with activity and better with rest. She denies any diarrhea or vomiting. Her last bowel movement was today. She tried old leftover Percocet which helps some.  Patient has a surgical history for hysterectomy. Past Medical History  Diagnosis Date  . Hypertension    History  Substance Use Topics  . Smoking status: Never Smoker   . Smokeless tobacco: Never Used  . Alcohol Use: 0.0 oz/week    1-2 Glasses of wine per week     Comment: 1-2 glasses every after noon   ROS as above Medications: No current facility-administered medications for this encounter.   Current Outpatient Prescriptions  Medication Sig Dispense Refill  . atenolol-chlorthalidone (TENORETIC) 50-25 MG per tablet Take 1 tablet by mouth daily.      Marland Kitchen acetaminophen (TYLENOL) 500 MG tablet Take 1,000 mg by mouth every 6 (six) hours as needed for moderate pain.      . cephALEXin (KEFLEX) 500 MG capsule Take 1 capsule (500 mg total) by mouth 3 (three) times daily.  21 capsule  0  . traMADol (ULTRAM) 50 MG tablet Take 1 tablet (50 mg total) by mouth every 6 (six) hours as needed.  15 tablet  0    Exam:  BP 153/73  Pulse 56  Temp(Src) 98.3 F (36.8 C) (Oral)  Resp 18  SpO2 98% Gen: Well NAD HEENT: EOMI,  MMM Lungs: Normal work of breathing. CTABL Heart: RRR no MRG Abd: NABS, Soft. Mildly tender left lower quadrant, ND no rebound or guarding. No CV angle tenderness to percussion Exts: Brisk capillary refill, warm and well perfused.  Pelvis: Normal external genitalia. Vaginal canal with thin white discharge. Vaginal cuff is normal-appearing. No adnexal masses or significant pain Hips: Nontender full range of motion  bilaterally   Results for orders placed during the hospital encounter of 07/03/13 (from the past 24 hour(s))  POCT URINALYSIS DIP (DEVICE)     Status: Abnormal   Collection Time    07/03/13 12:28 PM      Result Value Ref Range   Glucose, UA NEGATIVE  NEGATIVE mg/dL   Bilirubin Urine NEGATIVE  NEGATIVE   Ketones, ur NEGATIVE  NEGATIVE mg/dL   Specific Gravity, Urine 1.020  1.005 - 1.030   Hgb urine dipstick SMALL (*) NEGATIVE   pH 5.0  5.0 - 8.0   Protein, ur NEGATIVE  NEGATIVE mg/dL   Urobilinogen, UA 0.2  0.0 - 1.0 mg/dL   Nitrite NEGATIVE  NEGATIVE   Leukocytes, UA NEGATIVE  NEGATIVE  CBC     Status: Abnormal   Collection Time    07/03/13 12:51 PM      Result Value Ref Range   WBC 7.7  4.0 - 10.5 K/uL   RBC 4.97  3.87 - 5.11 MIL/uL   Hemoglobin 12.7  12.0 - 15.0 g/dL   HCT 38.8  36.0 - 46.0 %   MCV 78.1  78.0 - 100.0 fL   MCH 25.6 (*) 26.0 - 34.0 pg   MCHC 32.7  30.0 - 36.0 g/dL   RDW 13.2  11.5 - 15.5 %   Platelets 219  150 - 400 K/uL  COMPREHENSIVE METABOLIC PANEL     Status: Abnormal   Collection  Time    07/03/13 12:51 PM      Result Value Ref Range   Sodium 141  137 - 147 mEq/L   Potassium 3.7  3.7 - 5.3 mEq/L   Chloride 100  96 - 112 mEq/L   CO2 28  19 - 32 mEq/L   Glucose, Bld 91  70 - 99 mg/dL   BUN 12  6 - 23 mg/dL   Creatinine, Ser 0.77  0.50 - 1.10 mg/dL   Calcium 9.5  8.4 - 10.5 mg/dL   Total Protein 7.7  6.0 - 8.3 g/dL   Albumin 3.8  3.5 - 5.2 g/dL   AST 13  0 - 37 U/L   ALT 18  0 - 35 U/L   Alkaline Phosphatase 99  39 - 117 U/L   Total Bilirubin 0.2 (*) 0.3 - 1.2 mg/dL   GFR calc non Af Amer >90  >90 mL/min   GFR calc Af Amer >90  >90 mL/min   US Transvaginal Non-ob  07/03/2013   CLINICAL DATA:  Left lower quadrant pain.  Pelvic pain.  EXAM: TRANSABDOMINAL AND TRANSVAGINAL ULTRASOUND OF PELVIS  TECHNIQUE: Both transabdominal and transvaginal ultrasound examinations of the pelvis were performed. Transabdominal technique was performed for global  imaging of the pelvis including uterus, ovaries, adnexal regions, and pelvic cul-de-sac. It was necessary to proceed with endovaginal exam following the transabdominal exam to visualize the ovaries and adnexae.  COMPARISON:  None  FINDINGS: Uterus  Measurements: Surgically absent. Vaginal cuff is unremarkable in appearance .  Endometrium  Thickness: Not applicable.  Right ovary  Measurements: 2.8 x 1.6 x 2.5 cm. Normal appearance/no adnexal mass.  Left ovary  Measurements: Not directly visualized by transabdominal or transvaginal sonography, however no adnexal mass identified.  Other findings  No free fluid.  IMPRESSION: Prior hysterectomy.  Normal appearance of right ovary. Nonvisualization of left ovary, however no pelvic mass or other significant abnormality visualized.   Electronically Signed   By: Earle Gell M.D.   On: 07/03/2013 14:59   US Pelvis Complete  07/03/2013   CLINICAL DATA:  Left lower quadrant pain.  Pelvic pain.  EXAM: TRANSABDOMINAL AND TRANSVAGINAL ULTRASOUND OF PELVIS  TECHNIQUE: Both transabdominal and transvaginal ultrasound examinations of the pelvis were performed. Transabdominal technique was performed for global imaging of the pelvis including uterus, ovaries, adnexal regions, and pelvic cul-de-sac. It was necessary to proceed with endovaginal exam following the transabdominal exam to visualize the ovaries and adnexae.  COMPARISON:  None  FINDINGS: Uterus  Measurements: Surgically absent. Vaginal cuff is unremarkable in appearance .  Endometrium  Thickness: Not applicable.  Right ovary  Measurements: 2.8 x 1.6 x 2.5 cm. Normal appearance/no adnexal mass.  Left ovary  Measurements: Not directly visualized by transabdominal or transvaginal sonography, however no adnexal mass identified.  Other findings  No free fluid.  IMPRESSION: Prior hysterectomy.  Normal appearance of right ovary. Nonvisualization of left ovary, however no pelvic mass or other significant abnormality visualized.    Electronically Signed   By: Earle Gell M.D.   On: 07/03/2013 14:59    Assessment and Plan: 58 y.o. female with left lower abdominal to left pelvic pain. Unclear etiology. Possible kidney stone versus UTI. Possible diverticulitis however patient is not very tender. At this point the patient appears to be nonacute with a benign-appearing physical exam and relatively normal laboratory findings. Plan to treat empirically with Keflex and tramadol for pain control. Urine culture and vaginal cytology pending.  Followup with primary  care provider.  Discussed warning signs or symptoms. Please see discharge instructions. Patient expresses understanding.    Gregor Hams, MD 07/03/13 [phone]

## 2013-07-03 NOTE — ED Notes (Signed)
Pt c/o a constant LLQ pain that increases w/movement onset 6 days Has noticed freq urination Denies f/v/n/d, dysuria, hematuria Took percocet that gave her temp relief Alert w/no signs of acute distress.

## 2013-07-03 NOTE — Discharge Instructions (Signed)
Thank you for coming in today. If your belly pain worsens, or you have high fever, bad vomiting, blood in your stool or black tarry stool go to the Emergency Room.    Abdominal Pain, Women Abdominal (stomach, pelvic, or belly) pain can be caused by many things. It is important to tell your doctor:  The location of the pain.  Does it come and go or is it present all the time?  Are there things that start the pain (eating certain foods, exercise)?  Are there other symptoms associated with the pain (fever, nausea, vomiting, diarrhea)? All of this is helpful to know when trying to find the cause of the pain. CAUSES   Stomach: virus or bacteria infection, or ulcer.  Intestine: appendicitis (inflamed appendix), regional ileitis (Crohn's disease), ulcerative colitis (inflamed colon), irritable bowel syndrome, diverticulitis (inflamed diverticulum of the colon), or cancer of the stomach or intestine.  Gallbladder disease or stones in the gallbladder.  Kidney disease, kidney stones, or infection.  Pancreas infection or cancer.  Fibromyalgia (pain disorder).  Diseases of the female organs:  Uterus: fibroid (non-cancerous) tumors or infection.  Fallopian tubes: infection or tubal pregnancy.  Ovary: cysts or tumors.  Pelvic adhesions (scar tissue).  Endometriosis (uterus lining tissue growing in the pelvis and on the pelvic organs).  Pelvic congestion syndrome (female organs filling up with blood just before the menstrual period).  Pain with the menstrual period.  Pain with ovulation (producing an egg).  Pain with an IUD (intrauterine device, birth control) in the uterus.  Cancer of the female organs.  Functional pain (pain not caused by a disease, may improve without treatment).  Psychological pain.  Depression. DIAGNOSIS  Your doctor will decide the seriousness of your pain by doing an examination.  Blood tests.  X-rays.  Ultrasound.  CT scan (computed  tomography, special type of X-ray).  MRI (magnetic resonance imaging).  Cultures, for infection.  Barium enema (dye inserted in the large intestine, to better view it with X-rays).  Colonoscopy (looking in intestine with a lighted tube).  Laparoscopy (minor surgery, looking in abdomen with a lighted tube).  Major abdominal exploratory surgery (looking in abdomen with a large incision). TREATMENT  The treatment will depend on the cause of the pain.   Many cases can be observed and treated at home.  Over-the-counter medicines recommended by your caregiver.  Prescription medicine.  Antibiotics, for infection.  Birth control pills, for painful periods or for ovulation pain.  Hormone treatment, for endometriosis.  Nerve blocking injections.  Physical therapy.  Antidepressants.  Counseling with a psychologist or psychiatrist.  Minor or major surgery. HOME CARE INSTRUCTIONS   Do not take laxatives, unless directed by your caregiver.  Take over-the-counter pain medicine only if ordered by your caregiver. Do not take aspirin because it can cause an upset stomach or bleeding.  Try a clear liquid diet (broth or water) as ordered by your caregiver. Slowly move to a bland diet, as tolerated, if the pain is related to the stomach or intestine.  Have a thermometer and take your temperature several times a day, and record it.  Bed rest and sleep, if it helps the pain.  Avoid sexual intercourse, if it causes pain.  Avoid stressful situations.  Keep your follow-up appointments and tests, as your caregiver orders.  If the pain does not go away with medicine or surgery, you may try:  Acupuncture.  Relaxation exercises (yoga, meditation).  Group therapy.  Counseling. SEEK MEDICAL CARE IF:  You notice certain foods cause stomach pain.  Your home care treatment is not helping your pain.  You need stronger pain medicine.  You want your IUD removed.  You feel faint  or lightheaded.  You develop nausea and vomiting.  You develop a rash.  You are having side effects or an allergy to your medicine. SEEK IMMEDIATE MEDICAL CARE IF:   Your pain does not go away or gets worse.  You have a fever.  Your pain is felt only in portions of the abdomen. The right side could possibly be appendicitis. The left lower portion of the abdomen could be colitis or diverticulitis.  You are passing blood in your stools (bright red or black tarry stools, with or without vomiting).  You have blood in your urine.  You develop chills, with or without a fever.  You pass out. MAKE SURE YOU:   Understand these instructions.  Will watch your condition.  Will get help right away if you are not doing well or get worse. Document Released: 10/19/2006 Document Revised: 03/16/2011 Document Reviewed: 11/08/2008 [facility] Patient Information 2015 [place], [place]. This information is not intended to replace advice given to you by your health care provider. Make sure you discuss any questions you have with your health care provider.

## 2013-07-03 NOTE — ED Notes (Signed)
Patient transported to Ultrasound 

## 2013-07-04 ENCOUNTER — Telehealth (HOSPITAL_COMMUNITY): Payer: Self-pay | Admitting: Family Medicine

## 2013-07-04 LAB — URINE CULTURE
CULTURE: NO GROWTH
Colony Count: NO GROWTH

## 2013-07-04 MED ORDER — METRONIDAZOLE 500 MG PO TABS: 500.0000 mg | ORAL_TABLET | Freq: Two times a day (BID) | ORAL | Status: DC

## 2013-07-04 NOTE — ED Notes (Signed)
Cytology shows trichomonas. Flagyl called in.  Advised that sexual partners be tested.     Gregor Hams, MD 07/04/13 [phone]

## 2013-08-22 ENCOUNTER — Other Ambulatory Visit (INDEPENDENT_AMBULATORY_CARE_PROVIDER_SITE_OTHER): Payer: No Typology Code available for payment source

## 2013-08-22 DIAGNOSIS — Z0000 Encounter for general adult medical examination without abnormal findings: Secondary | ICD-10-CM

## 2013-08-22 LAB — BASIC METABOLIC PANEL
BUN: 14 mg/dL (ref 6–23)
CALCIUM: 9.4 mg/dL (ref 8.4–10.5)
CO2: 29 mEq/L (ref 19–32)
Chloride: 104 mEq/L (ref 96–112)
Creatinine, Ser: 0.8 mg/dL (ref 0.4–1.2)
GFR: 91.52 mL/min (ref >60.00)
Glucose, Bld: 94 mg/dL (ref 70–99)
POTASSIUM: 3.9 meq/L (ref 3.5–5.1)
Sodium: 140 mEq/L (ref 135–145)

## 2013-08-22 LAB — POCT URINALYSIS DIPSTICK
Bilirubin, UA: NEGATIVE
GLUCOSE UA: NEGATIVE
Ketones, UA: NEGATIVE
Leukocytes, UA: NEGATIVE
NITRITE UA: NEGATIVE
PH UA: 5
Protein, UA: NEGATIVE
SPEC GRAV UA: 1.015
UROBILINOGEN UA: 0.2

## 2013-08-22 LAB — LIPID PANEL
CHOL/HDL RATIO: 5
Cholesterol: 257 mg/dL — ABNORMAL HIGH (ref 0–200)
HDL: 47.8 mg/dL (ref >39.00)
LDL CALC: 187 mg/dL — AB (ref 0–99)
NONHDL: 209.2
TRIGLYCERIDES: 110 mg/dL (ref 0.0–149.0)
VLDL: 22 mg/dL (ref 0.0–40.0)

## 2013-08-22 LAB — CBC WITH DIFFERENTIAL/PLATELET
BASOS ABS: 0 10*3/uL (ref 0.0–0.1)
Basophils Relative: 0.5 % (ref 0.0–3.0)
EOS PCT: 3.3 % (ref 0.0–5.0)
Eosinophils Absolute: 0.3 10*3/uL (ref 0.0–0.7)
HEMATOCRIT: 41.5 % (ref 36.0–46.0)
Hemoglobin: 13.7 g/dL (ref 12.0–15.0)
LYMPHS ABS: 2.3 10*3/uL (ref 0.7–4.0)
LYMPHS PCT: 29.6 % (ref 12.0–46.0)
MCHC: 33.1 g/dL (ref 30.0–36.0)
MCV: 78.1 fl (ref 78.0–100.0)
MONOS PCT: 5.4 % (ref 3.0–12.0)
Monocytes Absolute: 0.4 10*3/uL (ref 0.1–1.0)
NEUTROS PCT: 61.2 % (ref 43.0–77.0)
Neutro Abs: 4.7 10*3/uL (ref 1.4–7.7)
PLATELETS: 243 10*3/uL (ref 150.0–400.0)
RBC: 5.31 Mil/uL — ABNORMAL HIGH (ref 3.87–5.11)
RDW: 13.9 % (ref 11.5–15.5)
WBC: 7.6 10*3/uL (ref 4.0–10.5)

## 2013-08-22 LAB — COMPREHENSIVE METABOLIC PANEL
ALBUMIN: 3.8 g/dL (ref 3.5–5.2)
ALT: 20 U/L (ref 0–35)
AST: 14 U/L (ref 0–37)
Alkaline Phosphatase: 89 U/L (ref 39–117)
BUN: 14 mg/dL (ref 6–23)
CALCIUM: 9.4 mg/dL (ref 8.4–10.5)
CHLORIDE: 104 meq/L (ref 96–112)
CO2: 29 meq/L (ref 19–32)
Creatinine, Ser: 0.8 mg/dL (ref 0.4–1.2)
GFR: 91.52 mL/min (ref >60.00)
GLUCOSE: 94 mg/dL (ref 70–99)
POTASSIUM: 3.9 meq/L (ref 3.5–5.1)
SODIUM: 140 meq/L (ref 135–145)
TOTAL PROTEIN: 7.7 g/dL (ref 6.0–8.3)
Total Bilirubin: 0.7 mg/dL (ref 0.2–1.2)

## 2013-08-22 LAB — TSH: TSH: 0.71 u[IU]/mL (ref 0.35–4.50)

## 2013-08-30 ENCOUNTER — Encounter: Payer: Self-pay | Admitting: Gastroenterology

## 2013-08-30 ENCOUNTER — Other Ambulatory Visit (HOSPITAL_COMMUNITY)
Admission: RE | Admit: 2013-08-30 | Discharge: 2013-08-30 | Disposition: A | Discharge status: Home / Self Care | Payer: No Typology Code available for payment source | Source: Ambulatory Visit | Attending: Family Medicine | Admitting: Family Medicine

## 2013-08-30 ENCOUNTER — Encounter: Payer: Self-pay | Admitting: Family Medicine

## 2013-08-30 ENCOUNTER — Ambulatory Visit (INDEPENDENT_AMBULATORY_CARE_PROVIDER_SITE_OTHER): Payer: No Typology Code available for payment source | Admitting: Family Medicine

## 2013-08-30 VITALS — BP 130/88 | Temp 98.3°F | Ht 64.5 in | Wt 164.0 lb

## 2013-08-30 DIAGNOSIS — N760 Acute vaginitis: Secondary | ICD-10-CM | POA: Diagnosis not present

## 2013-08-30 DIAGNOSIS — Z23 Encounter for immunization: Secondary | ICD-10-CM

## 2013-08-30 DIAGNOSIS — I10 Essential (primary) hypertension: Secondary | ICD-10-CM

## 2013-08-30 DIAGNOSIS — R1031 Right lower quadrant pain: Secondary | ICD-10-CM

## 2013-08-30 DIAGNOSIS — Z202 Contact with and (suspected) exposure to infections with a predominantly sexual mode of transmission: Secondary | ICD-10-CM

## 2013-08-30 DIAGNOSIS — G8929 Other chronic pain: Secondary | ICD-10-CM

## 2013-08-30 DIAGNOSIS — E785 Hyperlipidemia, unspecified: Secondary | ICD-10-CM

## 2013-08-30 MED ORDER — ATENOLOL-CHLORTHALIDONE 50-25 MG PO TABS: 1.0000 | ORAL_TABLET | Freq: Every day | ORAL | Status: DC

## 2013-08-30 NOTE — Progress Notes (Signed)
Subjective:    Patient ID: Alexis Stout, female    DOB: January 20, 1955, 58 y.o.   MRN: [id number]  HPI Alexis Stout is a 58 year old female single nonsmoker self-employed who comes in today for general physical examination because of a history of hypertension  She's on Tenoretic 1 daily BP 130/88  She states overall she feels well except she's had abdominal pain for the past 3 months. She states she went to the urgent care in July had a complete evaluation and was told she had trichomonas and was given Flagyl. She took the Flagyl however she's still having abdominal pain. She had her uterus removed when she was 23 for fibroids and dysfunctional uterine bleeding. Ovaries were left intact.  She's had no fever nausea vomiting diarrhea change in urinary habits or change in bowel habits. She's never had a colonoscopy. At the completion of her physical as she tells me that she has noticed some bright red blood on the tissue for about 6 months. No other change in bowel habits.  Father died in his 73s of a stroke, mother 48 of ovarian cancer, one out of 4 brothers has prostate cancer the other 4 in good health, 4 sisters one has breast cancer one had a stroke the other in good health  The tests that she had done at the urgent care dated 07/03/2013 shows a normal urinalysis CBC metabolic panel liver functions. Transvaginal ultrasound shows a normal right ovary nonvisualization of the left ovary but no pelvic mass or fluid. She was in treated apparently with Keflex with a productive diagnosis of urinary tract infection however urine and urine culture turned out to be normal.  Family history negative for colon cancer polyps   Review of Systems  Constitutional: Negative.   HENT: Negative.   Eyes: Negative.   Respiratory: Negative.   Cardiovascular: Negative.   Gastrointestinal: Negative.   Genitourinary: Negative.   Musculoskeletal: Negative.   Neurological: Negative.   Psychiatric/Behavioral: Negative.         Objective:   Physical Exam  Constitutional: She appears well-developed and well-nourished.  HENT:  Head: Normocephalic and atraumatic.  Right Ear: External ear normal.  Left Ear: External ear normal.  Nose: Nose normal.  Mouth/Throat: Oropharynx is clear and moist.  Eyes: EOM are normal. Pupils are equal, round, and reactive to light.  Neck: Normal range of motion. Neck supple. No thyromegaly present.  Cardiovascular: Normal rate, regular rhythm, normal heart sounds and intact distal pulses.  Exam reveals no gallop and no friction rub.   No murmur heard. Pulmonary/Chest: Effort normal and breath sounds normal.  Abdominal: Soft. Bowel sounds are normal. She exhibits no distension and no mass. There is no tenderness. There is no rebound.  Genitourinary: Vagina normal. Guaiac positive stool. No vaginal discharge found.   Fishy odor......Marland Kitchen vagina looks normal..... fluid take it for culture  Bilateral breast exam normal  Brown stool guaiac positive  Musculoskeletal: Normal range of motion.  Lymphadenopathy:    She has no cervical adenopathy.  Neurological: She is alert. She has normal reflexes. No cranial nerve deficit. She exhibits normal muscle tone. Coordination normal.  Skin: Skin is warm and dry.  Psychiatric: She has a normal mood and affect. Her behavior is normal. Judgment and thought content normal.          Assessment & Plan:  Healthy female  Hypertension at goal continue current therapy  Positive stool guaiac...Marland KitchenMarland KitchenMarland Kitchen in the setting of intermittent abdominal pain for 3 months......Marland Kitchen refer to GI to  begin workup  History of trichomonas....... culture pending

## 2013-08-30 NOTE — Progress Notes (Signed)
Pre visit review using our clinic review tool, if applicable. No additional management support is needed unless otherwise documented below in the visit note. 

## 2013-08-30 NOTE — Patient Instructions (Addendum)
We will get you set up a consult in GI ASAP to begin the evaluation  We will call you and get your vaginal culture back  Recommended eye exam with Dr. Bing Plume, dental cleaning ASAP, continue BSE monthly and annual mammography  Flu shot today

## 2013-09-14 ENCOUNTER — Ambulatory Visit: Payer: No Typology Code available for payment source | Admitting: Physician Assistant

## 2013-09-27 ENCOUNTER — Ambulatory Visit (INDEPENDENT_AMBULATORY_CARE_PROVIDER_SITE_OTHER): Payer: No Typology Code available for payment source | Admitting: Physician Assistant

## 2013-09-27 ENCOUNTER — Encounter: Payer: Self-pay | Admitting: Physician Assistant

## 2013-09-27 VITALS — BP 144/80 | HR 64 | Ht 63.25 in | Wt 164.5 lb

## 2013-09-27 DIAGNOSIS — K625 Hemorrhage of anus and rectum: Secondary | ICD-10-CM

## 2013-09-27 DIAGNOSIS — R1032 Left lower quadrant pain: Secondary | ICD-10-CM

## 2013-09-27 MED ORDER — NA SULFATE-K SULFATE-MG SULF 17.5-3.13-1.6 GM/177ML PO SOLN: 1.0000 | Freq: Once | ORAL | Status: DC

## 2013-09-27 NOTE — Patient Instructions (Signed)
You have been scheduled for a colonoscopy. Please follow written instructions given to you at your visit today.  Please pick up your prep kit at the pharmacy within the next 1-3 days. If you use inhalers (even only as needed), please bring them with you on the day of your procedure. Your physician has requested that you go to www.startemmi.com and enter the access code given to you at your visit today. This web site gives a general overview about your procedure. However, you should still follow specific instructions given to you by our office regarding your preparation for the procedure.   You have been scheduled for a CT scan of the abdomen and pelvis at [facility] ([address]---this is in the same building as [facility]).   You are scheduled on 09-29-2013 at 8:30 am . You should arrive 15 minutes prior to your appointment time for registration. Please follow the written instructions below on the day of your exam:  WARNING: IF YOU ARE ALLERGIC TO IODINE/X-RAY DYE, PLEASE NOTIFY RADIOLOGY IMMEDIATELY AT [phone]! YOU WILL BE GIVEN A 13 HOUR PREMEDICATION PREP.  1) Do not eat or drink anything after 4:30 AM (4 hours prior to your test) 2) You have been given 2 bottles of oral contrast to drink. The solution may taste  better if refrigerated, but do NOT add ice or any other liquid to this solution. Shake  ell before drinking.    Drink 1 bottle of contrast @6:30 am  (2 hours prior to your exam)  Drink 1 bottle of contrast @ 7:30  am  (1 hour prior to your exam)  You may take any medications as prescribed with a small amount of water except for the following: Metformin, Glucophage, Glucovance, Avandamet, Riomet, Fortamet, Actoplus Met, Janumet, Glumetza or Metaglip. The above medications must be held the day of the exam AND 48 hours after the exam.  The purpose of you drinking the oral contrast is to aid in the visualization of your intestinal tract. The contrast  solution may cause some diarrhea. Before your exam is started, you will be given a small amount of fluid to drink. Depending on your individual set of symptoms, you may also receive an intravenous injection of x-ray contrast/dye. Plan on being at [facility] for 30 minutes or long, depending on the type of exam you are having performed.  If you have any questions regarding your exam or if you need to reschedule, you may call the CT department at [phone] between the hours of 8:00 am and 5:00 pm, Monday-Friday.  ________________________________________________________________________ ;e 

## 2013-09-27 NOTE — Progress Notes (Signed)
Subjective:    Patient ID: Alexis Stout, female    DOB: Aug 26, 1955, 58 y.o.   MRN: [id number]  HPI Alexis Stout is a 58 year old African American female referred by her primary care physician Dr. Sherren Mocha for evaluation of lower abdominal pain. She says she has been having symptoms over the past 3 months with bilateral low abdominal pain which is somewhat pulling in quality. She says she is more aware of this with walking but has some discomfort present constantly which waxes and wanes. She has been having more frequent bowel movements which are more loose. She's been having 2-3 bowel movements per day compared her usual one bowel movement per day. She is also sporadically noted small amounts of bright red blood both on the tissue and in the stool. Her appetite has been okay her weight has been stable she's had no associated nausea or vomiting no fever or chills. She has not noted any change in her symptoms with by mouth intake, bowel movements or urination. She had gone to urgent care in June of 2015 with complaints of pelvic pain and at that time had a pelvic ultrasound which was negative. Most recent labs done in August were unremarkable with a hemoglobin of 13.7. She is status post hysterectomy. Family history is negative for colon cancer, mother deceased with ovarian cancer.  Patient has not had any prior colon evaluation.    Review of Systems  Constitutional: Negative.   HENT: Negative.   Eyes: Negative.   Respiratory: Negative.   Cardiovascular: Negative.   Gastrointestinal: Positive for abdominal pain, diarrhea and blood in stool.  Endocrine: Negative.   Genitourinary: Negative.   Musculoskeletal: Negative.   Skin: Negative.   Allergic/Immunologic: Negative.   Neurological: Negative.   Hematological: Negative.   Psychiatric/Behavioral: Negative.    Outpatient Prescriptions Prior to Visit  Medication Sig Dispense Refill  . atenolol-chlorthalidone (TENORETIC) 50-25 MG per tablet Take  1 tablet by mouth daily.  100 tablet  3   No facility-administered medications prior to visit.      No Known Allergies Patient Active Problem List   Diagnosis Date Noted  . Abdominal pain, chronic, right lower quadrant 08/30/2013  . HYPERLIPIDEMIA 05/21/2009  . HYPERTENSION 09/09/2006   History  Substance Use Topics  . Smoking status: Never Smoker   . Smokeless tobacco: Never Used  . Alcohol Use: 0.0 oz/week    1-2 Glasses of wine per week     Comment: 1-2 glasses every after noon   family history includes Breast cancer in her sister; Diabetes in her sister; Heart attack in her father; Ovarian cancer in her mother.  Objective:   Physical Exam   well-developed Afro-American female in no acute distress, pleasant blood pressure 144/80 pulse 64 height 5 foot 3 weight 164. HEENT; nontraumatic normocephalic EOMI PERRLA sclera anicteric, Supple; no JVD, Cardiovascular; regular rate and rhythm with S1-S2 no murmur or gallop, Pulmonary ;clear bilaterally, Abdomen ;soft nondistended bowel sounds are present there is no palpable mass or hepatosplenomegaly she is tender in the left lower quadrant and mildly in the suprapubic area, Rectal exam not done, Extremities; no clubbing cyanosis or edema skin warm and dry, Psych; mood and affect appropriate        Assessment & Plan:  #43 58 year old female with 3 month history of bilateral lower abdominal discomfort on exam or prominent in the left lower quadrant. She is also had a change in bowel habits with loose or more frequent stools and intermittent small-volume hematochezia with bowel  movements. Etiology of symptoms is not clear, need to rule out occult colon lesion, mild colitis or other intra-abdominal inflammatory process. #2 Status post hysterectomy  #3 hypertension #4 hyperlipidemia  Plan; will schedule patient for colonoscopy with Dr. Carlean Purl. Procedure discussed in detail with the patient and she is agreeable to proceed  Will also proceed  with CT scan of the abdomen and pelvis with contrast. Further plans pending results of above.

## 2013-09-29 ENCOUNTER — Ambulatory Visit (INDEPENDENT_AMBULATORY_CARE_PROVIDER_SITE_OTHER)
Admission: RE | Admit: 2013-09-29 | Discharge: 2013-09-29 | Disposition: A | Discharge status: Home / Self Care | Payer: No Typology Code available for payment source | Source: Ambulatory Visit | Attending: Physician Assistant | Admitting: Physician Assistant

## 2013-09-29 DIAGNOSIS — R1032 Left lower quadrant pain: Secondary | ICD-10-CM

## 2013-09-29 DIAGNOSIS — K625 Hemorrhage of anus and rectum: Secondary | ICD-10-CM

## 2013-09-29 MED ORDER — IOHEXOL 300 MG/ML  SOLN
100.0000 mL | Freq: Once | INTRAMUSCULAR | Status: AC | PRN
  Administered 2013-09-29: 100 mL via INTRAVENOUS

## 2013-10-02 ENCOUNTER — Other Ambulatory Visit: Payer: Self-pay | Admitting: *Deleted

## 2013-10-02 ENCOUNTER — Encounter: Payer: Self-pay | Admitting: *Deleted

## 2013-10-02 DIAGNOSIS — K625 Hemorrhage of anus and rectum: Secondary | ICD-10-CM

## 2013-10-02 MED ORDER — METRONIDAZOLE 500 MG PO TABS: ORAL_TABLET | ORAL | Status: DC

## 2013-10-02 MED ORDER — CIPROFLOXACIN HCL 500 MG PO TABS: ORAL_TABLET | ORAL | Status: DC

## 2013-10-05 ENCOUNTER — Telehealth: Payer: Self-pay

## 2013-10-05 NOTE — Progress Notes (Signed)
Agree with Ms. Genia Harold assessment and plan.   I see CT results - ? Diverticulitis and also ? Large sigmoid polyp.  Let's try to change colonoscopy to my hospital; week late October MAC or moderate Gatha Mayer, MD, [facility]

## 2013-10-05 NOTE — Telephone Encounter (Signed)
Left message for patient to call back  

## 2013-10-05 NOTE — Telephone Encounter (Signed)
Message copied by Marlon Pel on Thu Oct 05, 2013  2:01 PM ------      Message from: Silvano Rusk E      Created: Thu Oct 05, 2013  7:34 AM                   ----- Message -----         From: Alfredia Ferguson, PA-C         Sent: 09/27/2013   1:35 PM           To: Gatha Mayer, MD             ------

## 2013-10-09 NOTE — Telephone Encounter (Signed)
Left message for patient to call back  

## 2013-10-10 ENCOUNTER — Encounter: Payer: No Typology Code available for payment source | Admitting: Internal Medicine

## 2013-10-10 NOTE — Telephone Encounter (Signed)
Discussed with the Patient and she is rescheduled for 10/25/13 4:00. New instructions mailed to the patient

## 2013-10-13 ENCOUNTER — Encounter: Payer: Self-pay | Admitting: Internal Medicine

## 2013-10-20 ENCOUNTER — Telehealth: Payer: Self-pay | Admitting: Physician Assistant

## 2013-10-20 DIAGNOSIS — R197 Diarrhea, unspecified: Secondary | ICD-10-CM

## 2013-10-20 NOTE — Telephone Encounter (Signed)
Patient notified of recommendations. She will try to come to lab on Monday.

## 2013-10-20 NOTE — Telephone Encounter (Signed)
Spoke with patient and she states she completed the Cipro and Flagyl on Wednesday. She is now having watery diarrhea 2 x/day. She states the diarrhea is worse than when she came for OV. She is scheduled for colonoscopy on 10/25/13. She is concerned the antibiotics caused the diarrhea. Please, advise.

## 2013-10-20 NOTE — Telephone Encounter (Signed)
Maybe the Abx caused diarrhea but had some before  Start align 1 qd Check C diff PCR

## 2013-10-23 ENCOUNTER — Other Ambulatory Visit: Payer: No Typology Code available for payment source

## 2013-10-23 DIAGNOSIS — R197 Diarrhea, unspecified: Secondary | ICD-10-CM

## 2013-10-24 LAB — CLOSTRIDIUM DIFFICILE BY PCR: Toxigenic C. Difficile by PCR: NOT DETECTED

## 2013-10-25 ENCOUNTER — Ambulatory Visit (AMBULATORY_SURGERY_CENTER): Payer: No Typology Code available for payment source | Admitting: Internal Medicine

## 2013-10-25 ENCOUNTER — Encounter: Payer: Self-pay | Admitting: Internal Medicine

## 2013-10-25 VITALS — BP 142/77 | HR 55 | Temp 98.3°F | Resp 29 | Ht 63.0 in | Wt 164.0 lb

## 2013-10-25 DIAGNOSIS — D125 Benign neoplasm of sigmoid colon: Secondary | ICD-10-CM

## 2013-10-25 DIAGNOSIS — R933 Abnormal findings on diagnostic imaging of other parts of digestive tract: Secondary | ICD-10-CM

## 2013-10-25 DIAGNOSIS — R197 Diarrhea, unspecified: Secondary | ICD-10-CM

## 2013-10-25 MED ORDER — SODIUM CHLORIDE 0.9 % IV SOLN: 500.0000 mL | INTRAVENOUS | Status: DC

## 2013-10-25 NOTE — Progress Notes (Signed)
Called to room to assist during endoscopic procedure.  Patient ID and intended procedure confirmed with present staff. Received instructions for my participation in the procedure from the performing physician.  

## 2013-10-25 NOTE — Progress Notes (Signed)
A/ox3 pleased with MAC, report to Robbin RN 

## 2013-10-25 NOTE — Op Note (Addendum)
[place]  [address]. [place], [address]   COLONOSCOPY PROCEDURE REPORT  PATIENT: [name], Alexis Stout  MR#: [id number] BIRTHDATE: Mar 24, 1955 , 60  yrs. old GENDER: female ENDOSCOPIST: Gatha Mayer, MD, [facility] PROCEDURE DATE:  10/25/2013 PROCEDURE:   Colonoscopy with biopsy and Colonoscopy with snare polypectomy First Screening Colonoscopy - Avg.  risk and is 50 yrs.  old or older - No.  Prior Negative Screening - Now for repeat screening. N/A  History of Adenoma - Now for follow-up colonoscopy & has been > or = to 3 yrs.  N/A  Polyps Removed Today? Yes. ASA CLASS:   Class II INDICATIONS:an abnormal CT and unexplained diarrhea. MEDICATIONS: Propofol 200 mg IV and Monitored anesthesia care  DESCRIPTION OF PROCEDURE:   After the risks benefits and alternatives of the procedure were thoroughly explained, informed consent was obtained.  The digital rectal exam revealed no abnormalities of the rectum.   The LB [id number]  endoscope was introduced through the anus and advanced to the terminal ileum which was intubated for a short distance. No adverse events experienced.   The quality of the prep was excellent using Suprep The instrument was then slowly withdrawn as the colon was fully examined.   COLON FINDINGS: The examined terminal ileum appeared to be normal. A pedunculated polyp measuring 20 mm in size was found in the sigmoid colon.  A polypectomy was performed using snare cautery. The resection was complete, the polyp tissue was completely retrieved and sent to histology.   The colonic mucosa appeared normal throughout the entire examined colon.  Multiple random biopsies were performed using cold forceps.  Samples were sent to R/O microscopic colitis.   Right colon retroflexion included. Retroflexed views revealed no abnormalities. The time to cecum=1 minutes 58 seconds.  Withdrawal time=9 minutes 58 seconds.  The scope was withdrawn and the procedure  completed. COMPLICATIONS: There were no immediate complications.  ENDOSCOPIC IMPRESSION: 1.   The examined terminal ileum appeared to be normal 2.   Pedunculated polyp (seen on CT) was found in the sigmoid colon; polypectomy was performed using snare cautery 3.   The colonic mucosa appeared normal throughout the entire examined colon; multiple random biopsies were performed 4.  The mild diverticulois seen on CT is not appreciated. I am not convinced she has had divertculitis in retrospect.  RECOMMENDATIONS: 1.  Hold Aspirin and all other NSAIDS for 2 weeks. 2.  Office will call with the results. She may need NSAI Tx as sxs now (pain) sound possibly musculoskeletal - in groin, increase w/ walking 3.  Imodium as needed for diarrhea  eSigned:  Gatha Mayer, MD, [facility] 10/25/2013 5:08 PM Revised: 10/25/2013 5:08 PM cc: Christie Nottingham, MD and The Patient

## 2013-10-25 NOTE — Patient Instructions (Addendum)
I found and removed one polyp - it was seen on CT. It was large but looks benign (not cancer). Everything else looked ok - I took biopsies to see if there is colon inflammation causing diarrhea.  I will let you know results and plans by next week. OK to use Imodium for diarrhea.  I appreciate the opportunity to care for you. Gatha Mayer, MD, [facility]  Hold aspirin, and all other NSAIDS for 2 weeks!! Polyp handout given today. Call us as needed. Thank you!  YOU HAD AN ENDOSCOPIC PROCEDURE TODAY AT [facility]: Refer to the procedure report that was given to you for any specific questions about what was found during the examination.  If the procedure report does not answer your questions, please call your gastroenterologist to clarify.  If you requested that your care partner not be given the details of your procedure findings, then the procedure report has been included in a sealed envelope for you to review at your convenience later.  YOU SHOULD EXPECT: Some feelings of bloating in the abdomen. Passage of more gas than usual.  Walking can help get rid of the air that was put into your GI tract during the procedure and reduce the bloating. If you had a lower endoscopy (such as a colonoscopy or flexible sigmoidoscopy) you may notice spotting of blood in your stool or on the toilet paper. If you underwent a bowel prep for your procedure, then you may not have a normal bowel movement for a few days.  DIET: Your first meal following the procedure should be a light meal and then it is ok to progress to your normal diet.  A half-sandwich or bowl of soup is an example of a good first meal.  Heavy or fried foods are harder to digest and may make you feel nauseous or bloated.  Likewise meals heavy in dairy and vegetables can cause extra gas to form and this can also increase the bloating.  Drink plenty of fluids but you should avoid alcoholic beverages for 24 hours.  ACTIVITY: Your  care partner should take you home directly after the procedure.  You should plan to take it easy, moving slowly for the rest of the day.  You can resume normal activity the day after the procedure however you should NOT DRIVE or use heavy machinery for 24 hours (because of the sedation medicines used during the test).    SYMPTOMS TO REPORT IMMEDIATELY: A gastroenterologist can be reached at any hour.  During normal business hours, 8:30 AM to 5:00 PM Monday through Friday, call [phone].  After hours and on weekends, please call the GI answering service at [phone] who will take a message and have the physician on call contact you.   Following lower endoscopy (colonoscopy or flexible sigmoidoscopy):  Excessive amounts of blood in the stool  Significant tenderness or worsening of abdominal pains  Swelling of the abdomen that is new, acute  Fever of 100F or higher  Following upper endoscopy (EGD)  Vomiting of blood or coffee ground material  New chest pain or pain under the shoulder blades  Painful or persistently difficult swallowing  New shortness of breath  Fever of 100F or higher  Black, tarry-looking stools  FOLLOW UP: If any biopsies were taken you will be contacted by phone or by letter within the next 1-3 weeks.  Call your gastroenterologist if you have not heard about the biopsies in 3 weeks.  Our staff  will call the home number listed on your records the next business day following your procedure to check on you and address any questions or concerns that you may have at that time regarding the information given to you following your procedure. This is a courtesy call and so if there is no answer at the home number and we have not heard from you through the emergency physician on call, we will assume that you have returned to your regular daily activities without incident.  SIGNATURES/CONFIDENTIALITY: You and/or your care partner have signed paperwork which will be  entered into your electronic medical record.  These signatures attest to the fact that that the information above on your After Visit Summary has been reviewed and is understood.  Full responsibility of the confidentiality of this discharge information lies with you and/or your care-partner.

## 2013-10-26 ENCOUNTER — Telehealth: Payer: Self-pay | Admitting: *Deleted

## 2013-10-26 NOTE — Telephone Encounter (Signed)
Voice mailbox full ,unable to leave message on f/u callback

## 2013-11-02 ENCOUNTER — Other Ambulatory Visit: Payer: Self-pay | Admitting: Internal Medicine

## 2013-11-03 ENCOUNTER — Other Ambulatory Visit: Payer: Self-pay

## 2013-11-03 MED ORDER — MELOXICAM 15 MG PO TABS: 7.5000 mg | ORAL_TABLET | Freq: Every day | ORAL | Status: DC

## 2013-11-23 ENCOUNTER — Encounter: Payer: No Typology Code available for payment source | Admitting: Internal Medicine

## 2014-11-08 ENCOUNTER — Other Ambulatory Visit: Payer: Self-pay | Admitting: Family Medicine

## 2014-12-13 ENCOUNTER — Ambulatory Visit: Payer: No Typology Code available for payment source | Admitting: *Deleted

## 2014-12-13 ENCOUNTER — Ambulatory Visit: Payer: No Typology Code available for payment source

## 2015-01-31 ENCOUNTER — Other Ambulatory Visit: Payer: Self-pay | Admitting: Family Medicine

## 2015-04-30 ENCOUNTER — Other Ambulatory Visit: Payer: No Typology Code available for payment source

## 2015-05-07 ENCOUNTER — Encounter: Payer: No Typology Code available for payment source | Admitting: Family Medicine

## 2015-11-11 ENCOUNTER — Other Ambulatory Visit (INDEPENDENT_AMBULATORY_CARE_PROVIDER_SITE_OTHER): Payer: Self-pay

## 2015-11-11 DIAGNOSIS — Z0000 Encounter for general adult medical examination without abnormal findings: Secondary | ICD-10-CM

## 2015-11-11 LAB — CBC WITH DIFFERENTIAL/PLATELET
BASOS ABS: 0 10*3/uL (ref 0.0–0.1)
Basophils Relative: 0.4 % (ref 0.0–3.0)
Eosinophils Absolute: 0.1 10*3/uL (ref 0.0–0.7)
Eosinophils Relative: 1.8 % (ref 0.0–5.0)
HEMATOCRIT: 41.1 % (ref 36.0–46.0)
Hemoglobin: 13.6 g/dL (ref 12.0–15.0)
LYMPHS PCT: 23.2 % (ref 12.0–46.0)
Lymphs Abs: 1.7 10*3/uL (ref 0.7–4.0)
MCHC: 33 g/dL (ref 30.0–36.0)
MCV: 77.7 fl — AB (ref 78.0–100.0)
MONOS PCT: 4 % (ref 3.0–12.0)
Monocytes Absolute: 0.3 10*3/uL (ref 0.1–1.0)
NEUTROS PCT: 70.6 % (ref 43.0–77.0)
Neutro Abs: 5.2 10*3/uL (ref 1.4–7.7)
Platelets: 241 10*3/uL (ref 150.0–400.0)
RBC: 5.3 Mil/uL — AB (ref 3.87–5.11)
RDW: 13.8 % (ref 11.5–15.5)
WBC: 7.4 10*3/uL (ref 4.0–10.5)

## 2015-11-11 LAB — LIPID PANEL
CHOL/HDL RATIO: 5
CHOLESTEROL: 255 mg/dL — AB (ref 0–200)
HDL: 52.1 mg/dL (ref >39.00)
LDL CALC: 172 mg/dL — AB (ref 0–99)
NonHDL: 203
Triglycerides: 154 mg/dL — ABNORMAL HIGH (ref 0.0–149.0)
VLDL: 30.8 mg/dL (ref 0.0–40.0)

## 2015-11-11 LAB — POC URINALSYSI DIPSTICK (AUTOMATED)
BILIRUBIN UA: NEGATIVE
Glucose, UA: NEGATIVE
KETONES UA: NEGATIVE
LEUKOCYTES UA: NEGATIVE
Nitrite, UA: NEGATIVE
PH UA: 5.5
PROTEIN UA: NEGATIVE
SPEC GRAV UA: 1.025
Urobilinogen, UA: 0.2

## 2015-11-11 LAB — HEPATIC FUNCTION PANEL
ALBUMIN: 4.1 g/dL (ref 3.5–5.2)
ALK PHOS: 84 U/L (ref 39–117)
ALT: 17 U/L (ref 0–35)
AST: 12 U/L (ref 0–37)
BILIRUBIN DIRECT: 0.1 mg/dL (ref 0.0–0.3)
TOTAL PROTEIN: 7 g/dL (ref 6.0–8.3)
Total Bilirubin: 0.6 mg/dL (ref 0.2–1.2)

## 2015-11-11 LAB — BASIC METABOLIC PANEL
BUN: 15 mg/dL (ref 6–23)
CALCIUM: 9.4 mg/dL (ref 8.4–10.5)
CO2: 27 mEq/L (ref 19–32)
Chloride: 108 mEq/L (ref 96–112)
Creatinine, Ser: 0.81 mg/dL (ref 0.40–1.20)
GFR: 92.14 mL/min (ref >60.00)
Glucose, Bld: 102 mg/dL — ABNORMAL HIGH (ref 70–99)
Potassium: 3.8 mEq/L (ref 3.5–5.1)
SODIUM: 143 meq/L (ref 135–145)

## 2015-11-11 LAB — TSH: TSH: 0.95 u[IU]/mL (ref 0.35–4.50)

## 2015-11-18 ENCOUNTER — Encounter: Payer: Self-pay | Admitting: Family Medicine

## 2015-11-26 ENCOUNTER — Encounter: Payer: Self-pay | Admitting: Family Medicine

## 2015-11-26 ENCOUNTER — Ambulatory Visit (INDEPENDENT_AMBULATORY_CARE_PROVIDER_SITE_OTHER): Payer: Self-pay | Admitting: Family Medicine

## 2015-11-26 VITALS — BP 140/80 | HR 83 | Temp 97.7°F | Resp 12 | Ht 63.0 in | Wt 166.2 lb

## 2015-11-26 DIAGNOSIS — Z1231 Encounter for screening mammogram for malignant neoplasm of breast: Secondary | ICD-10-CM

## 2015-11-26 DIAGNOSIS — Z0000 Encounter for general adult medical examination without abnormal findings: Secondary | ICD-10-CM

## 2015-11-26 DIAGNOSIS — I10 Essential (primary) hypertension: Secondary | ICD-10-CM

## 2015-11-26 DIAGNOSIS — E782 Mixed hyperlipidemia: Secondary | ICD-10-CM

## 2015-11-26 DIAGNOSIS — M2550 Pain in unspecified joint: Secondary | ICD-10-CM

## 2015-11-26 DIAGNOSIS — Z1239 Encounter for other screening for malignant neoplasm of breast: Secondary | ICD-10-CM

## 2015-11-26 DIAGNOSIS — R319 Hematuria, unspecified: Secondary | ICD-10-CM

## 2015-11-26 DIAGNOSIS — R002 Palpitations: Secondary | ICD-10-CM

## 2015-11-26 LAB — URINALYSIS, MICROSCOPIC ONLY: WBC, UA: NONE SEEN (ref 0–?)

## 2015-11-26 MED ORDER — ATORVASTATIN CALCIUM 10 MG PO TABS: 10.0000 mg | ORAL_TABLET | Freq: Every day | ORAL | 1 refills | Status: DC

## 2015-11-26 MED ORDER — MELOXICAM 15 MG PO TABS
7.5000 mg | ORAL_TABLET | Freq: Every day | ORAL | 3 refills | Status: DC
Start: 2015-11-26 — End: 2016-03-30

## 2015-11-26 MED ORDER — ATENOLOL-CHLORTHALIDONE 50-25 MG PO TABS: 1.0000 | ORAL_TABLET | Freq: Every day | ORAL | 2 refills | Status: DC

## 2015-11-26 NOTE — Progress Notes (Signed)
Pre visit review using our clinic review tool, if applicable. No additional management support is needed unless otherwise documented below in the visit note. 

## 2015-11-26 NOTE — Progress Notes (Signed)
HPI:   [name] is a 60 y.o. female, who is here today for her routine physical and to follow on some of her chronic medical conditions. Former patient of Dr. Sherren Mocha.   She  Exercises about once per week regularly and does follow a healthy diet.  She lives alone, daughter lives close by. Independent ADLs and IADLs.  Chronic medical problems: HTN,HLD,arthralgias among some.  Pap smear : s/p hysterectomy for fibroids. Hx of abnormal pap smears: Denies. Hx of STD's: 5-6 years ago. No currently sexually active.   Mammogram 12/2012 Birads 1 Colonoscopy 10/2013.   FHx for gynecologic: sister breast cancer at 66 and mother ovarian cancer in her 38's. Fhx for colon cancer negative.   Hep C screening: Denies risk factors.  Hypertension:   She does not check BP  Currently on Tenoretic 50-25 mgdaily.    She is taking medications as instructed, no side effects reported.  She has not noted unusual headache, visual changes, exertional chest pain, dyspnea,  focal weakness, or edema.   Lab Results  Component Value Date   CREATININE 0.81 11/11/2015   BUN 15 11/11/2015   NA 143 11/11/2015   K 3.8 11/11/2015   CL 108 11/11/2015   CO2 27 11/11/2015   Today she is complaining of occasional palpitations, one or twice per month and for the past 1-2 years. Usually happens at night while resting watching TV, she denies any episode during exertion and denies associated chest pain, dyspnea, diaphoresis, or dizziness. He last a few minutes. She has not identified exacerbating or alleviating factors.  HLD:  Currently she is on non-pharmacologic treatment. She has tried statin medication in the past.  Lab Results  Component Value Date   CHOL 255 (H) 11/11/2015   HDL 52.10 11/11/2015   LDLCALC 172 (H) 11/11/2015   LDLDIRECT 179.9 05/21/2009   TRIG 154.0 (H) 11/11/2015   CHOLHDL 5 11/11/2015   Arthralgias: She is currently on Mobic 15 mg, she takes half tablet at  the time and as needed. She has intermittent arthralgias on right hip, toes, knees, and sometimes IP joints. She denies any side effect and medication really helps with pain.  She had labs done recently:  Lab Results  Component Value Date   TSH 0.95 11/11/2015   Lab Results  Component Value Date   WBC 7.4 11/11/2015   HGB 13.6 11/11/2015   HCT 41.1 11/11/2015   MCV 77.7 (L) 11/11/2015   PLT 241.0 11/11/2015   Lab Results  Component Value Date   ALT 17 11/11/2015   AST 12 11/11/2015   ALKPHOS 84 11/11/2015   BILITOT 0.6 11/11/2015   Urine dipstick was also done, otherwise negative except for 1+ blood. About 2 years ago also positive for blood. No smoker,deneis gross hematuria, dysuria, foam in urine, or Hx of nephrolithiasis.    Review of Systems  Constitutional: Negative for appetite change, fatigue, fever and unexpected weight change.  HENT: Negative for dental problem, hearing loss, mouth sores, trouble swallowing and voice change.   Eyes: Negative for photophobia, pain and visual disturbance.  Respiratory: Negative for cough, shortness of breath and wheezing.   Cardiovascular: Positive for palpitations. Negative for chest pain and leg swelling.  Gastrointestinal: Negative for abdominal pain, nausea and vomiting.       No changes in bowel habits.  Endocrine: Negative for cold intolerance and heat intolerance.  Genitourinary: Negative for decreased urine volume, difficulty urinating, dysuria, hematuria, vaginal bleeding and vaginal discharge.  Musculoskeletal: Positive for arthralgias. Negative for back pain, gait problem and neck pain.  Skin: Negative for color change and rash.  Neurological: Negative for syncope, weakness and headaches.  Hematological: Negative for adenopathy. Does not bruise/bleed easily.  Psychiatric/Behavioral: Negative for confusion and sleep disturbance. The patient is not nervous/anxious.   All other systems reviewed and are negative.     No  current outpatient prescriptions on file prior to visit.   No current facility-administered medications on file prior to visit.      Past Medical History:  Diagnosis Date  . Arthritis   . GERD (gastroesophageal reflux disease)   . Hypertension     No Known Allergies  Family History  Problem Relation Age of Onset  . Heart attack Father   . Ovarian cancer Mother   . Breast cancer Sister   . Diabetes Sister     x 3    Social History   Social History  . Marital status: Divorced    Spouse name: N/A  . Number of children: 1  . Years of education: N/A   Occupational History  . property mgmt    Social History Main Topics  . Smoking status: Never Smoker  . Smokeless tobacco: Never Used  . Alcohol use 0.0 oz/week    1 - 2 Glasses of wine per week     Comment: 1-2 glasses every after noon  . Drug use:     Types: Marijuana  . Sexual activity: No     Comment: socially   Other Topics Concern  . None   Social History Narrative  . None     Vitals:   11/26/15 0805  BP: 140/80  Pulse: 83  Resp: 12  Temp: 97.7 F (36.5 C)   Body mass index is 29.45 kg/m.  O2 sat at RA 97%  Wt Readings from Last 3 Encounters:  11/26/15 166 lb 4 oz (75.4 kg)  10/25/13 164 lb (74.4 kg)  09/27/13 164 lb 8 oz (74.6 kg)     Physical Exam  Nursing note and vitals reviewed. Constitutional: She is oriented to person, place, and time. She appears well-developed. No distress.  HENT:  Head: Atraumatic.  Right Ear: Hearing, tympanic membrane, external ear and ear canal normal.  Left Ear: Hearing, tympanic membrane, external ear and ear canal normal.  Mouth/Throat: Uvula is midline, oropharynx is clear and moist and mucous membranes are normal.  Eyes: Conjunctivae and EOM are normal. Pupils are equal, round, and reactive to light.  Neck: No JVD present. No tracheal deviation present. Thyromegaly (mild, palpable) present. No thyroid mass present.  Cardiovascular: Normal rate and  regular rhythm.   Murmur (? soft SEM RUSB) heard. Pulses:      Dorsalis pedis pulses are 2+ on the right side, and 2+ on the left side.       Posterior tibial pulses are 2+ on the right side, and 2+ on the left side.  Respiratory: Effort normal and breath sounds normal. No respiratory distress.  GI: Soft. She exhibits no mass. There is no hepatomegaly. There is no tenderness.  Genitourinary: No breast swelling, tenderness or discharge.  Genitourinary Comments: No breast masses or nipple discharge appreciated, bilaterally.  Musculoskeletal: She exhibits no edema.  Mild IP toes deformities, R>L, bunions bilateral. No signs of synovitis appreciated.  Lymphadenopathy:    She has no cervical adenopathy.    She has no axillary adenopathy.       Right: No supraclavicular adenopathy present.  Left: No supraclavicular adenopathy present.  Neurological: She is alert and oriented to person, place, and time. She has normal strength. No cranial nerve deficit. Coordination and gait normal.  Reflex Scores:      Bicep reflexes are 2+ on the right side and 2+ on the left side.      Patellar reflexes are 2+ on the right side and 2+ on the left side. Skin: Skin is warm. No rash noted. No erythema.  Psychiatric: She has a normal mood and affect. Her speech is normal.  Well groomed, good eye contact.      ASSESSMENT AND PLAN:    Dash was seen today for annual exam.  Diagnoses and all orders for this visit:    Routine general medical examination at a health care facility   We discussed the importance of regular physical activity and healthy diet for prevention of chronic illness and/or complications. Preventive guidelines reviewed. Vaccination up to date, refused influenza vaccine. Aspirin for primary prevention discussed and recommended Aspirin 81 mg daily. Ca++ and vit D supplementation Recommended. Next CPE in 1 year.  -     Mammogram Digital Screening; Future  Breast cancer  screening  -     Mammogram Digital Screening; Future  Hematuria, unspecified type  1+ blood in recent urinalysis. Also 2 years ago. Asymptomatic. Further recommendations will be given according to urine microscopic examination.  -     Urinalysis, microscopic only  Essential hypertension  Otherwise adequately controlled, SBP 140. Recommend monitoring BP at home. No changes in current management. DASH-low salt diet recommended. Eye exam recommended annually. F/U in 4 months, before if needed.  -     atenolol-chlorthalidone (TENORETIC) 50-25 MG tablet; Take 1 tablet by mouth daily.  Hyperlipidemia, mixed  Still elevated. After discussion of some side effects of statin medications, she agrees with trying Lipitor. Continue low fat diet. F/U in 3-4 months.  -     atorvastatin (LIPITOR) 10 MG tablet; Take 1 tablet (10 mg total) by mouth daily.  Arthralgia of multiple sites  Hx of OA. Some side effects of NSAID's discussed. Will continue monitoring renal function and HTN.  -     meloxicam (MOBIC) 15 MG tablet; Take 0.5 tablets (7.5 mg total) by mouth daily.  Palpitations  We discussed possible causes, it seems mild. She has had EKG done, 08/2013 normal otherwise. Monitor for new symptoms I instructed about warning signs. F/U in 4 months.     Return in 4 months (on 03/25/2016) for HTN,palpitations,HLD,fasting labs.          Adelaida Reindel G. Martinique, MD  [facility]. [place] office.

## 2015-11-26 NOTE — Patient Instructions (Addendum)
A few things to remember from today's visit:   Routine general medical examination at a health care facility - Plan: Mammogram Digital Screening  Breast cancer screening - Plan: Mammogram Digital Screening  Hematuria, unspecified type - Plan: Urinalysis, microscopic only  Essential hypertension - Plan: atenolol-chlorthalidone (TENORETIC) 50-25 MG tablet  Hyperlipidemia, mixed - Plan: atorvastatin (LIPITOR) 10 MG tablet  Arthralgia of multiple sites - Plan: meloxicam (MOBIC) 15 MG tablet   Please be sure medication list is accurate. If a new problem present, please set up appointment sooner than planned today.        Health Maintenance, Female Introduction Adopting a healthy lifestyle and getting preventive care can go a long way to promote health and wellness. Talk with your health care provider about what schedule of regular examinations is right for you. This is a good chance for you to check in with your provider about disease prevention and staying healthy. In between checkups, there are plenty of things you can do on your own. Experts have done a lot of research about which lifestyle changes and preventive measures are most likely to keep you healthy. Ask your health care provider for more information. Weight and diet Eat a healthy diet  Be sure to include plenty of vegetables, fruits, low-fat dairy products, and lean protein.  Do not eat a lot of foods high in solid fats, added sugars, or salt.  Get regular exercise. This is one of the most important things you can do for your health.  Most adults should exercise for at least 150 minutes each week. The exercise should increase your heart rate and make you sweat (moderate-intensity exercise).  Most adults should also do strengthening exercises at least twice a week. This is in addition to the moderate-intensity exercise. Maintain a healthy weight  Body mass index (BMI) is a measurement that can be used to identify  possible weight problems. It estimates body fat based on height and weight. Your health care provider can help determine your BMI and help you achieve or maintain a healthy weight.  For females 5 years of age and older:  A BMI below 18.5 is considered underweight.  A BMI of 18.5 to 24.9 is normal.  A BMI of 25 to 29.9 is considered overweight.  A BMI of 30 and above is considered obese. Watch levels of cholesterol and blood lipids  You should start having your blood tested for lipids and cholesterol at 62 years of age, then have this test every 5 years.  You may need to have your cholesterol levels checked more often if:  Your lipid or cholesterol levels are high.  You are older than 62 years of age.  You are at high risk for heart disease. Cancer screening Lung Cancer  Lung cancer screening is recommended for adults 21-33 years old who are at high risk for lung cancer because of a history of smoking.  A yearly low-dose CT scan of the lungs is recommended for people who:  Currently smoke.  Have quit within the past 15 years.  Have at least a 30-pack-year history of smoking. A pack year is smoking an average of one pack of cigarettes a day for 1 year.  Yearly screening should continue until it has been 15 years since you quit.  Yearly screening should stop if you develop a health problem that would prevent you from having lung cancer treatment. Breast Cancer  Practice breast self-awareness. This means understanding how your breasts normally appear and feel.  It also means doing regular breast self-exams. Let your health care provider know about any changes, no matter how small.  If you are in your 20s or 30s, you should have a clinical breast exam (CBE) by a health care provider every 1-3 years as part of a regular health exam.  If you are 43 or older, have a CBE every year. Also consider having a breast X-ray (mammogram) every year.  If you have a family history of  breast cancer, talk to your health care provider about genetic screening.  If you are at high risk for breast cancer, talk to your health care provider about having an MRI and a mammogram every year.  Breast cancer gene (BRCA) assessment is recommended for women who have family members with BRCA-related cancers. BRCA-related cancers include:  Breast.  Ovarian.  Tubal.  Peritoneal cancers.  Results of the assessment will determine the need for genetic counseling and BRCA1 and BRCA2 testing. Cervical Cancer  Your health care provider may recommend that you be screened regularly for cancer of the pelvic organs (ovaries, uterus, and vagina). This screening involves a pelvic examination, including checking for microscopic changes to the surface of your cervix (Pap test). You may be encouraged to have this screening done every 3 years, beginning at age 63.  For women ages 63-65, health care providers may recommend pelvic exams and Pap testing every 3 years, or they may recommend the Pap and pelvic exam, combined with testing for human papilloma virus (HPV), every 5 years. Some types of HPV increase your risk of cervical cancer. Testing for HPV may also be done on women of any age with unclear Pap test results.  Other health care providers may not recommend any screening for nonpregnant women who are considered low risk for pelvic cancer and who do not have symptoms. Ask your health care provider if a screening pelvic exam is right for you.  If you have had past treatment for cervical cancer or a condition that could lead to cancer, you need Pap tests and screening for cancer for at least 20 years after your treatment. If Pap tests have been discontinued, your risk factors (such as having a new sexual partner) need to be reassessed to determine if screening should resume. Some women have medical problems that increase the chance of getting cervical cancer. In these cases, your health care provider may  recommend more frequent screening and Pap tests. Colorectal Cancer  This type of cancer can be detected and often prevented.  Routine colorectal cancer screening usually begins at 62 years of age and continues through 62 years of age.  Your health care provider may recommend screening at an earlier age if you have risk factors for colon cancer.  Your health care provider may also recommend using home test kits to check for hidden blood in the stool.  A small camera at the end of a tube can be used to examine your colon directly (sigmoidoscopy or colonoscopy). This is done to check for the earliest forms of colorectal cancer.  Routine screening usually begins at age 59.  Direct examination of the colon should be repeated every 5-10 years through 62 years of age. However, you may need to be screened more often if early forms of precancerous polyps or small growths are found. Skin Cancer  Check your skin from head to toe regularly.  Tell your health care provider about any new moles or changes in moles, especially if there is a change in  a mole's shape or color.  Also tell your health care provider if you have a mole that is larger than the size of a pencil eraser.  Always use sunscreen. Apply sunscreen liberally and repeatedly throughout the day.  Protect yourself by wearing long sleeves, pants, a wide-brimmed hat, and sunglasses whenever you are outside. Heart disease, diabetes, and high blood pressure  High blood pressure causes heart disease and increases the risk of stroke. High blood pressure is more likely to develop in:  People who have blood pressure in the high end of the normal range (130-139/85-89 mm Hg).  People who are overweight or obese.  People who are African American.  If you are 31-23 years of age, have your blood pressure checked every 3-5 years. If you are 24 years of age or older, have your blood pressure checked every year. You should have your blood pressure  measured twice-once when you are at a hospital or clinic, and once when you are not at a hospital or clinic. Record the average of the two measurements. To check your blood pressure when you are not at a hospital or clinic, you can use:  An automated blood pressure machine at a pharmacy.  A home blood pressure monitor.  If you are between 55 years and 71 years old, ask your health care provider if you should take aspirin to prevent strokes.  Have regular diabetes screenings. This involves taking a blood sample to check your fasting blood sugar level.  If you are at a normal weight and have a low risk for diabetes, have this test once every three years after 62 years of age.  If you are overweight and have a high risk for diabetes, consider being tested at a younger age or more often. Preventing infection Hepatitis B  If you have a higher risk for hepatitis B, you should be screened for this virus. You are considered at high risk for hepatitis B if:  You were born in a country where hepatitis B is common. Ask your health care provider which countries are considered high risk.  Your parents were born in a high-risk country, and you have not been immunized against hepatitis B (hepatitis B vaccine).  You have HIV or AIDS.  You use needles to inject street drugs.  You live with someone who has hepatitis B.  You have had sex with someone who has hepatitis B.  You get hemodialysis treatment.  You take certain medicines for conditions, including cancer, organ transplantation, and autoimmune conditions. Hepatitis C  Blood testing is recommended for:  Everyone born from [birth year] through 1967.  Anyone with known risk factors for hepatitis C. Sexually transmitted infections (STIs)  You should be screened for sexually transmitted infections (STIs) including gonorrhea and chlamydia if:  You are sexually active and are younger than 62 years of age.  You are older than 62 years of age and  your health care provider tells you that you are at risk for this type of infection.  Your sexual activity has changed since you were last screened and you are at an increased risk for chlamydia or gonorrhea. Ask your health care provider if you are at risk.  If you do not have HIV, but are at risk, it may be recommended that you take a prescription medicine daily to prevent HIV infection. This is called pre-exposure prophylaxis (PrEP). You are considered at risk if:  You are sexually active and do not regularly use condoms or know the  HIV status of your partner(s).  You take drugs by injection.  You are sexually active with a partner who has HIV. Talk with your health care provider about whether you are at high risk of being infected with HIV. If you choose to begin PrEP, you should first be tested for HIV. You should then be tested every 3 months for as long as you are taking PrEP. Osteoporosis and menopause   Osteoporosis is a disease in which the bones lose minerals and strength with aging. This can result in serious bone fractures. Your risk for osteoporosis can be identified using a bone density scan.  If you are 56 years of age or older, or if you are at risk for osteoporosis and fractures, ask your health care provider if you should be screened.  Ask your health care provider whether you should take a calcium or vitamin D supplement to lower your risk for osteoporosis.  Menopause may have certain physical symptoms and risks.  Hormone replacement therapy may reduce some of these symptoms and risks. Talk to your health care provider about whether hormone replacement therapy is right for you. Follow these instructions at home:  Schedule regular health, dental, and eye exams.  Stay current with your immunizations.  Do not use any tobacco products including cigarettes, chewing tobacco, or electronic cigarettes.  If you are pregnant, do not drink alcohol.  If you are  breastfeeding, limit how much and how often you drink alcohol.  Limit alcohol intake to no more than 1 drink per day for nonpregnant women. One drink equals 12 ounces of beer, 5 ounces of wine, or 1 ounces of hard liquor.  Do not use street drugs.  Do not share needles.  Ask your health care provider for help if you need support or information about quitting drugs.  Tell your health care provider if you often feel depressed.  Tell your health care provider if you have ever been abused or do not feel safe at home. This information is not intended to replace advice given to you by your health care provider. Make sure you discuss any questions you have with your health care provider. Document Released: 07/07/2010 Document Revised: 05/30/2015 Document Reviewed: 09/25/2014  2017 [employer]vier

## 2015-12-02 ENCOUNTER — Telehealth: Payer: Self-pay | Admitting: Emergency Medicine

## 2015-12-02 MED ORDER — SIMVASTATIN 10 MG PO TABS: 10.0000 mg | ORAL_TABLET | Freq: Every day | ORAL | 3 refills | Status: DC

## 2015-12-02 NOTE — Telephone Encounter (Signed)
Rx can be changed for Simvastatin 10 mg daily, # 30/3. Thanks, BJ

## 2015-12-02 NOTE — Telephone Encounter (Signed)
Placed order for simvastastin. Rx sent to [facility], we will contact pt to make aware.

## 2015-12-02 NOTE — Telephone Encounter (Signed)
Patient states that cholesterol medication is too expensive.

## 2016-01-03 ENCOUNTER — Ambulatory Visit (INDEPENDENT_AMBULATORY_CARE_PROVIDER_SITE_OTHER): Payer: No Typology Code available for payment source | Admitting: Adult Health

## 2016-01-03 ENCOUNTER — Encounter: Payer: Self-pay | Admitting: Adult Health

## 2016-01-03 VITALS — BP 138/70 | Temp 97.9°F | Ht 63.0 in | Wt 167.4 lb

## 2016-01-03 DIAGNOSIS — K047 Periapical abscess without sinus: Secondary | ICD-10-CM | POA: Diagnosis not present

## 2016-01-03 MED ORDER — PENICILLIN V POTASSIUM 500 MG PO TABS: 500.0000 mg | ORAL_TABLET | Freq: Four times a day (QID) | ORAL | 0 refills | Status: AC

## 2016-01-03 MED ORDER — NAPROXEN 500 MG PO TABS
500.0000 mg | ORAL_TABLET | Freq: Two times a day (BID) | ORAL | 0 refills | Status: DC
Start: 2016-01-03 — End: 2016-03-30

## 2016-01-03 NOTE — Progress Notes (Signed)
   Subjective:    Patient ID: Alexis Stout, female    DOB: 09/30/1955, 60 y.o.   MRN: [id number]  HPI  60 year old female who presents to the office today for an acute issue. She reports that " I think I broke it. It happens about three weeks ago. She reports pain at the site.   Denies any fevers or drainage.   She has an appointment with her dentist on 01/13/2015.    Review of Systems   See HPI  Past Medical History:  Diagnosis Date  . Arthritis   . GERD (gastroesophageal reflux disease)   . Hypertension     Social History   Social History  . Marital status: Divorced    Spouse name: N/A  . Number of children: 1  . Years of education: N/A   Occupational History  . property mgmt    Social History Main Topics  . Smoking status: Never Smoker  . Smokeless tobacco: Never Used  . Alcohol use 0.0 oz/week    1 - 2 Glasses of wine per week     Comment: 1-2 glasses every after noon  . Drug use:     Types: Marijuana  . Sexual activity: No     Comment: socially   Other Topics Concern  . Not on file   Social History Narrative  . No narrative on file    Past Surgical History:  Procedure Laterality Date  . ABDOMINAL HYSTERECTOMY    . ROTATOR CUFF REPAIR Right     Family History  Problem Relation Age of Onset  . Heart attack Father   . Ovarian cancer Mother   . Breast cancer Sister   . Diabetes Sister     x 3    No Known Allergies  Current Outpatient Prescriptions on File Prior to Visit  Medication Sig Dispense Refill  . atenolol-chlorthalidone (TENORETIC) 50-25 MG tablet Take 1 tablet by mouth daily. 90 tablet 2  . simvastatin (ZOCOR) 10 MG tablet Take 1 tablet (10 mg total) by mouth daily. 30 tablet 3  . meloxicam (MOBIC) 15 MG tablet Take 0.5 tablets (7.5 mg total) by mouth daily. (Patient not taking: Reported on 01/03/2016) 30 tablet 3   No current facility-administered medications on file prior to visit.     BP 138/70   Temp 97.9 F (36.6 C) (Oral)    Ht 5\' 3"  (1.6 m)   Wt 167 lb 6.4 oz (75.9 kg)   BMI 29.65 kg/m       Objective:   Physical Exam  Constitutional: She appears well-developed and well-nourished. No distress.  HENT:  Mouth/Throat: No oropharyngeal exudate.    Cardiovascular: Exam reveals no gallop and no friction rub.   Skin: Skin is warm and dry. No rash noted. She is not diaphoretic. No erythema. No pallor.  Psychiatric: She has a normal mood and affect. Her behavior is normal. Judgment and thought content normal.  Nursing note and vitals reviewed.     Assessment & Plan:  1. Dental abscess - penicillin v potassium (VEETID) 500 MG tablet; Take 1 tablet (500 mg total) by mouth 4 (four) times daily.  Dispense: 28 tablet; Refill: 0 - naproxen (NAPROSYN) 500 MG tablet; Take 1 tablet (500 mg total) by mouth 2 (two) times daily with a meal.  Dispense: 30 tablet; Refill: 0 - Follow up with dentist   Dorothyann Peng, NP

## 2016-03-24 ENCOUNTER — Ambulatory Visit: Payer: Self-pay | Admitting: Family Medicine

## 2016-03-24 ENCOUNTER — Ambulatory Visit
Admission: RE | Admit: 2016-03-24 | Discharge: 2016-03-24 | Disposition: A | Discharge status: Home / Self Care | Payer: No Typology Code available for payment source | Source: Ambulatory Visit | Attending: Family Medicine | Admitting: Family Medicine

## 2016-03-24 DIAGNOSIS — Z1239 Encounter for other screening for malignant neoplasm of breast: Secondary | ICD-10-CM

## 2016-03-24 DIAGNOSIS — Z0000 Encounter for general adult medical examination without abnormal findings: Secondary | ICD-10-CM

## 2016-03-29 DIAGNOSIS — M159 Polyosteoarthritis, unspecified: Secondary | ICD-10-CM | POA: Insufficient documentation

## 2016-03-29 NOTE — Progress Notes (Signed)
HPI:   [name] is a 61 y.o. female, who is here today for 4 months follow up.   Since her last OV,11/26/15,she had dental abscess,seen 01/03/16. She visited her dentist and had tooth pull out, pain resolved   Hyperlipidemia:  Currently on Zocor 10 mg daily. Following a low fat diet: Not consistently.  No has not noted side effects with medication.  Lab Results  Component Value Date   CHOL 255 (H) 11/11/2015   HDL 52.10 11/11/2015   LDLCALC 172 (H) 11/11/2015   LDLDIRECT 179.9 05/21/2009   TRIG 154.0 (H) 11/11/2015   CHOLHDL 5 11/11/2015    Hypertension:   Currently on Tenoretic 50-25 mg daily.  She is taking medications as instructed, no side effects reported. She is not checking BP at home.  She has not noted unusual headache, visual changes, exertional chest pain, dyspnea,  focal weakness, or edema.   Lab Results  Component Value Date   CREATININE 0.81 11/11/2015   BUN 15 11/11/2015   NA 143 11/11/2015   K 3.8 11/11/2015   CL 108 11/11/2015   CO2 27 11/11/2015   Last OV she was also c/o palpitations, not related with exertion and no associated chest pain or dizziness. She states that this improved greatly after she started Simvastatin. EKG machine was not available last OV.   Generalized OA: She did not fill Mobic 7.5-15 mg, states that after we discussed some side effects she never started it but she feels like she needs it. She tried Tylenol, does not help much Intermittent IP joints, hip (R),knees,toes pain. Intensity: "not bad". Exacerbated by certain activities. Alleviated by Rest.    Review of Systems  Constitutional: Negative for appetite change, fatigue, fever and unexpected weight change.  HENT: Negative for hearing loss, mouth sores and trouble swallowing.   Eyes: Negative for photophobia, pain and visual disturbance.  Respiratory: Negative for cough, shortness of breath and wheezing.   Cardiovascular: Negative for chest  pain and leg swelling.  Gastrointestinal: Negative for abdominal pain, nausea and vomiting.       No changes in bowel habits.  Genitourinary: Negative for decreased urine volume and hematuria.  Musculoskeletal: Positive for arthralgias. Negative for gait problem, joint swelling and myalgias.  Skin: Negative for rash.  Neurological: Negative for syncope, weakness and headaches.  Psychiatric/Behavioral: Negative for confusion and sleep disturbance. The patient is not nervous/anxious.       Current Outpatient Prescriptions on File Prior to Visit  Medication Sig Dispense Refill  . simvastatin (ZOCOR) 10 MG tablet Take 1 tablet (10 mg total) by mouth daily. 30 tablet 3   No current facility-administered medications on file prior to visit.      Past Medical History:  Diagnosis Date  . Arthritis   . GERD (gastroesophageal reflux disease)   . Hypertension    No Known Allergies  Social History   Social History  . Marital status: Divorced    Spouse name: N/A  . Number of children: 1  . Years of education: N/A   Occupational History  . property mgmt    Social History Main Topics  . Smoking status: Never Smoker  . Smokeless tobacco: Never Used  . Alcohol use 0.0 oz/week    1 - 2 Glasses of wine per week     Comment: 1-2 glasses every after noon  . Drug use: Yes    Types: Marijuana  . Sexual activity: No     Comment: socially  Other Topics Concern  . None   Social History Narrative  . None    Vitals:   03/30/16 0909  BP: 130/80  Pulse: 69  Resp: 12  O2 sat at RA 96%. Body mass index is 28.87 kg/m.   Physical Exam  Nursing note and vitals reviewed. Constitutional: She is oriented to person, place, and time. She appears well-developed. No distress.  HENT:  Head: Atraumatic.  Mouth/Throat: Oropharynx is clear and moist and mucous membranes are normal.  Eyes: Conjunctivae and EOM are normal. Pupils are equal, round, and reactive to light.  Cardiovascular:  Normal rate and regular rhythm.   Murmur (soft SEM RUSB) heard. Pulses:      Dorsalis pedis pulses are 2+ on the right side, and 2+ on the left side.  Respiratory: Effort normal and breath sounds normal. No respiratory distress.  GI: Soft. She exhibits no mass. There is no hepatomegaly. There is no tenderness.  Musculoskeletal: She exhibits no edema or tenderness.  No signs of synovitis appreciated, no significant deformity.  Lymphadenopathy:    She has no cervical adenopathy.  Neurological: She is alert and oriented to person, place, and time. She has normal strength. Gait normal.  Skin: Skin is warm. No rash noted. No erythema.  Psychiatric: She has a normal mood and affect.  Well groomed, good eye contact.      ASSESSMENT AND PLAN:   Alexis Stout was seen today for follow-up.  Diagnoses and all orders for this visit:    Chemistry      Component Value Date/Time   NA 142 03/30/2016 1017   K 3.1 (L) 03/30/2016 1017   CL 103 03/30/2016 1017   CO2 32 03/30/2016 1017   BUN 15 03/30/2016 1017   CREATININE 0.82 03/30/2016 1017      Component Value Date/Time   CALCIUM 9.7 03/30/2016 1017   ALKPHOS 83 03/30/2016 1017   AST 14 03/30/2016 1017   ALT 17 03/30/2016 1017   BILITOT 0.6 03/30/2016 1017     Lab Results  Component Value Date   CHOL 256 (H) 03/30/2016   HDL 40.30 03/30/2016   LDLCALC 188 (H) 03/30/2016   LDLDIRECT 179.9 05/21/2009   TRIG 138.0 03/30/2016   CHOLHDL 6 03/30/2016     Generalized osteoarthritis of multiple sites  Some side effects of chronic use of NSAID's reviewed. Benefits vs risk discussed. We will continue following renal function and HTN closely. Low impact exercise,Tai Chi may help.  -     meloxicam (MOBIC) 7.5 MG tablet; Take 1 tablet (7.5 mg total) by mouth daily as needed for pain.  Hyperlipidemia, mixed  No changes in current management, will follow labs done today and will give further recommendations accordingly. F/U in 6-12  months.  -     Comprehensive metabolic panel -     Lipid panel  Essential hypertension  Adequately controlled. No changes in current management. DASH diet recommended. Eye exam annually. F/U in 6 months, before if needed.  -     Comprehensive metabolic panel -     EKG 56-LOVF -     atenolol-chlorthalidone (TENORETIC) 50-25 MG tablet; Take 1 tablet by mouth daily.  Heart palpitations  Improved. EKG today:  SR, ? Sinus arrhythmia.Normal axis and intervals, normal otherwise. No significant differences when compared with EKG in 2015. We will monitor for now, instructed about warning signs.  -     EKG 12-Lead      -[name] was advised to return sooner than  planned today if new concerns arise.       Betty G. Martinique, MD  [facility]. [place] office.

## 2016-03-30 ENCOUNTER — Ambulatory Visit (INDEPENDENT_AMBULATORY_CARE_PROVIDER_SITE_OTHER): Payer: No Typology Code available for payment source | Admitting: Family Medicine

## 2016-03-30 ENCOUNTER — Encounter: Payer: Self-pay | Admitting: Family Medicine

## 2016-03-30 VITALS — BP 130/80 | HR 69 | Resp 12 | Ht 63.0 in | Wt 163.0 lb

## 2016-03-30 DIAGNOSIS — I10 Essential (primary) hypertension: Secondary | ICD-10-CM

## 2016-03-30 DIAGNOSIS — M159 Polyosteoarthritis, unspecified: Secondary | ICD-10-CM | POA: Diagnosis not present

## 2016-03-30 DIAGNOSIS — R002 Palpitations: Secondary | ICD-10-CM

## 2016-03-30 DIAGNOSIS — E782 Mixed hyperlipidemia: Secondary | ICD-10-CM | POA: Diagnosis not present

## 2016-03-30 LAB — COMPREHENSIVE METABOLIC PANEL
ALT: 17 U/L (ref 0–35)
AST: 14 U/L (ref 0–37)
Albumin: 4.4 g/dL (ref 3.5–5.2)
Alkaline Phosphatase: 83 U/L (ref 39–117)
BUN: 15 mg/dL (ref 6–23)
CHLORIDE: 103 meq/L (ref 96–112)
CO2: 32 mEq/L (ref 19–32)
Calcium: 9.7 mg/dL (ref 8.4–10.5)
Creatinine, Ser: 0.82 mg/dL (ref 0.40–1.20)
GFR: 90.74 mL/min (ref >60.00)
GLUCOSE: 118 mg/dL — AB (ref 70–99)
POTASSIUM: 3.1 meq/L — AB (ref 3.5–5.1)
SODIUM: 142 meq/L (ref 135–145)
Total Bilirubin: 0.6 mg/dL (ref 0.2–1.2)
Total Protein: 7.3 g/dL (ref 6.0–8.3)

## 2016-03-30 LAB — LIPID PANEL
CHOL/HDL RATIO: 6
Cholesterol: 256 mg/dL — ABNORMAL HIGH (ref 0–200)
HDL: 40.3 mg/dL (ref >39.00)
LDL CALC: 188 mg/dL — AB (ref 0–99)
NONHDL: 215.87
Triglycerides: 138 mg/dL (ref 0.0–149.0)
VLDL: 27.6 mg/dL (ref 0.0–40.0)

## 2016-03-30 MED ORDER — MELOXICAM 7.5 MG PO TABS: 7.5000 mg | ORAL_TABLET | Freq: Every day | ORAL | 1 refills | Status: DC | PRN

## 2016-03-30 MED ORDER — ATENOLOL-CHLORTHALIDONE 50-25 MG PO TABS: 1.0000 | ORAL_TABLET | Freq: Every day | ORAL | 2 refills | Status: DC

## 2016-03-30 NOTE — Patient Instructions (Addendum)
A few things to remember from today's visit:   Generalized osteoarthritis of multiple sites - Plan: meloxicam (MOBIC) 7.5 MG tablet  Hyperlipidemia, mixed - Plan: Comprehensive metabolic panel, Lipid panel  Essential hypertension - Plan: Comprehensive metabolic panel, EKG 51-VOHY  Heart palpitations - Plan: EKG 12-Lead  Fat and Cholesterol Restricted Diet Getting too much fat and cholesterol in your diet may cause health problems. Following this diet helps keep your fat and cholesterol at normal levels. This can keep you from getting sick. What types of fat should I choose?  Choose monosaturated and polyunsaturated fats. These are found in foods such as olive oil, canola oil, flaxseeds, walnuts, almonds, and seeds.  Eat more omega-3 fats. Good choices include salmon, mackerel, sardines, tuna, flaxseed oil, and ground flaxseeds.  Limit saturated fats. These are in animal products such as meats, butter, and cream. They can also be in plant products such as palm oil, palm kernel oil, and coconut oil.  Avoid foods with partially hydrogenated oils in them. These contain trans fats. Examples of foods that have trans fats are stick margarine, some tub margarines, cookies, crackers, and other baked goods. What general guidelines do I need to follow?  Check food labels. Look for the words "trans fat" and "saturated fat."  When preparing a meal:  Fill half of your plate with vegetables and green salads.  Fill one fourth of your plate with whole grains. Look for the word "whole" as the first word in the ingredient list.  Fill one fourth of your plate with lean protein foods.  Eat more foods that have fiber, like apples, carrots, beans, peas, and barley.  Eat more home-cooked foods. Eat less at restaurants and buffets.  Limit or avoid alcohol.  Limit foods high in starch and sugar.  Limit fried foods.  Cook foods without frying them. Baking, boiling, grilling, and broiling are all  great options.  Lose weight if you are overweight. Losing even a small amount of weight can help your overall health. It can also help prevent diseases such as diabetes and heart disease. What foods can I eat? Grains  Whole grains, such as whole wheat or whole grain breads, crackers, cereals, and pasta. Unsweetened oatmeal, bulgur, barley, quinoa, or brown rice. Corn or whole wheat flour tortillas. Vegetables  Fresh or frozen vegetables (raw, steamed, roasted, or grilled). Green salads. Fruits  All fresh, canned (in natural juice), or frozen fruits. Meat and Other Protein Products  Ground beef (85% or leaner), grass-fed beef, or beef trimmed of fat. Skinless chicken or [place]. Ground chicken or [place]. Pork trimmed of fat. All fish and seafood. Eggs. Dried beans, peas, or lentils. Unsalted nuts or seeds. Unsalted canned or dry beans. Dairy  Low-fat dairy products, such as skim or 1% milk, 2% or reduced-fat cheeses, low-fat ricotta or cottage cheese, or plain low-fat yogurt. Fats and Oils  Tub margarines without trans fats. Light or reduced-fat mayonnaise and salad dressings. Avocado. Olive, canola, sesame, or safflower oils. Natural peanut or almond butter (choose ones without added sugar and oil). The items listed above may not be a complete list of recommended foods or beverages. Contact your dietitian for more options.  What foods are not recommended? Grains  White bread. White pasta. White rice. Cornbread. Bagels, pastries, and croissants. Crackers that contain trans fat. Vegetables  White potatoes. Corn. Creamed or fried vegetables. Vegetables in a cheese sauce. Fruits  Dried fruits. Canned fruit in light or heavy syrup. Fruit juice. Meat and Other Protein  Products  Fatty cuts of meat. Ribs, chicken wings, bacon, sausage, bologna, salami, chitterlings, fatback, hot dogs, bratwurst, and packaged luncheon meats. Liver and organ meats. Dairy  Whole or 2% milk, cream, half-and-half, and  cream cheese. Whole milk cheeses. Whole-fat or sweetened yogurt. Full-fat cheeses. Nondairy creamers and whipped toppings. Processed cheese, cheese spreads, or cheese curds. Sweets and Desserts  Corn syrup, sugars, honey, and molasses. Candy. Jam and jelly. Syrup. Sweetened cereals. Cookies, pies, cakes, donuts, muffins, and ice cream. Fats and Oils  Butter, stick margarine, lard, shortening, ghee, or bacon fat. Coconut, palm kernel, or palm oils. Beverages  Alcohol. Sweetened drinks (such as sodas, lemonade, and fruit drinks or punches). The items listed above may not be a complete list of foods and beverages to avoid. Contact your dietitian for more information.  This information is not intended to replace advice given to you by your health care provider. Make sure you discuss any questions you have with your health care provider. Document Released: 06/23/2011 Document Revised: 08/29/2015 Document Reviewed: 03/23/2013 Elsevier Interactive Patient Education  2017 [place].   Please be sure medication list is accurate. If a new problem present, please set up appointment sooner than planned today.

## 2016-03-30 NOTE — Progress Notes (Signed)
Pre visit review using our clinic review tool, if applicable. No additional management support is needed unless otherwise documented below in the visit note. 

## 2016-04-01 ENCOUNTER — Other Ambulatory Visit: Payer: Self-pay

## 2016-04-01 MED ORDER — SIMVASTATIN 20 MG PO TABS: 20.0000 mg | ORAL_TABLET | Freq: Every day | ORAL | 3 refills | Status: DC

## 2016-05-21 ENCOUNTER — Telehealth: Payer: Self-pay | Admitting: Family Medicine

## 2016-05-21 NOTE — Telephone Encounter (Signed)
Please advise 

## 2016-05-21 NOTE — Telephone Encounter (Signed)
° ° °  Pt call to say the below med breaks her out in itching  and rashes. She is asking if there is something else you would like to RX her   Pharmacy  [place]

## 2016-05-21 NOTE — Telephone Encounter (Signed)
Left voicemail for patient to call back to find out which medication is breaking her out.

## 2016-05-21 NOTE — Telephone Encounter (Signed)
° ° ° ° °  simvastatin (ZOCOR) 20 MG tablet  

## 2016-05-21 NOTE — Telephone Encounter (Signed)
Simvastatin was started in 11/2015 and recently increased from 10 mg to 20 mg.I am not sure this medication is causing the problem. If she is certain this medication is causing rash, discontinued it.  OTC Zyrtec 10 mg daily and Pepcid 20 mg bid may help with rash.  When rash resolves she can either try it again or try a different one: Atorvastatin, Crestor, Lovastatin, or Pravastatin.  Thanks, BJ

## 2016-05-22 NOTE — Telephone Encounter (Signed)
Called and spoke with patient. The rash is gone, benadryl cleared it up. She is headed out of town, but will call back once she is back to decide on a new medication.

## 2016-07-07 ENCOUNTER — Telehealth: Payer: Self-pay | Admitting: Family Medicine

## 2016-07-07 NOTE — Telephone Encounter (Signed)
Can pharmacy fill Atenolol 50 mg and Chlorthalidone 25 mg individual tabs instead combination?  Thanks, BJ

## 2016-07-07 NOTE — Telephone Encounter (Signed)
Received a fax from the pharmacy.  atenolol-chlorthalidone (TENORETIC) 50-25 MG tablet is on back order.  Need an alternative medication.  [facility] to [facility] [address]

## 2016-07-09 MED ORDER — ATENOLOL 50 MG PO TABS: 50.0000 mg | ORAL_TABLET | Freq: Every day | ORAL | 1 refills | Status: DC

## 2016-07-09 MED ORDER — CHLORTHALIDONE 25 MG PO TABS: 25.0000 mg | ORAL_TABLET | Freq: Every day | ORAL | 1 refills | Status: DC

## 2016-07-09 NOTE — Telephone Encounter (Signed)
Called and spoke with pharmacy. They are able to fill the medication individually. New Rx's sent to pharmacy.

## 2016-09-29 NOTE — Progress Notes (Deleted)
HPI:   [name] is a 61 y.o. female, who is here today for 6 months follow up.   She was last seen on 03/30/16.  HTN: Currently on Atenolol 50 mg daily. Last eye exam: *** BP readings at home: *** Denies severe/frequent headache, visual changes, chest pain, dyspnea, palpitation, claudication, focal weakness, or edema.  HypoK+, HCTZ was discontinued.    Lab Results  Component Value Date   CREATININE 0.82 03/30/2016   BUN 15 03/30/2016   NA 142 03/30/2016   K 3.1 (L) 03/30/2016   CL 103 03/30/2016   CO2 32 03/30/2016    HLD:  Currently on Simvastatin 20 mg daily, increased from 10 mg last OV. She is *** following low fat diet.  Lab Results  Component Value Date   CHOL 256 (H) 03/30/2016   HDL 40.30 03/30/2016   LDLCALC 188 (H) 03/30/2016   LDLDIRECT 179.9 05/21/2009   TRIG 138.0 03/30/2016   CHOLHDL 6 03/30/2016    Generalized OA: She is currently on Mobic 7.5 mg daily as needed. Hip,knee,toes,and IP hands. Pain ***/10. Exacerbated by certain activities and alleviated by rest.    Review of Systems  [review of 2 to 9 systems] ***  Current Outpatient Prescriptions on File Prior to Visit  Medication Sig Dispense Refill  . atenolol (TENORMIN) 50 MG tablet Take 1 tablet (50 mg total) by mouth daily. 90 tablet 1  . atenolol-chlorthalidone (TENORETIC) 50-25 MG tablet Take 1 tablet by mouth daily. 90 tablet 2  . chlorthalidone (HYGROTON) 25 MG tablet Take 1 tablet (25 mg total) by mouth daily. 90 tablet 1  . meloxicam (MOBIC) 7.5 MG tablet Take 1 tablet (7.5 mg total) by mouth daily as needed for pain. 30 tablet 1  . simvastatin (ZOCOR) 20 MG tablet Take 1 tablet (20 mg total) by mouth at bedtime. 90 tablet 3   No current facility-administered medications on file prior to visit.      Past Medical History:  Diagnosis Date  . Arthritis   . GERD (gastroesophageal reflux disease)   . Hypertension    No Known Allergies  Social History    Social History  . Marital status: Divorced    Spouse name: N/A  . Number of children: 1  . Years of education: N/A   Occupational History  . property mgmt    Social History Main Topics  . Smoking status: Never Smoker  . Smokeless tobacco: Never Used  . Alcohol use 0.0 oz/week    1 - 2 Glasses of wine per week     Comment: 1-2 glasses every after noon  . Drug use: Yes    Types: Marijuana  . Sexual activity: No     Comment: socially   Other Topics Concern  . Not on file   Social History Narrative  . No narrative on file    There were no vitals filed for this visit. There is no height or weight on file to calculate BMI.      Physical Exam  [12+ exam elements] ***  ASSESSMENT AND PLAN:   [name] was seen today for *** months follow-up.   There are no diagnoses linked to this encounter.         -[name] was advised to return sooner than planned today if new concerns arise.       Lukasz Rogus G. Martinique, MD  [facility]. [employer] office.

## 2016-09-30 ENCOUNTER — Ambulatory Visit: Payer: No Typology Code available for payment source | Admitting: Family Medicine

## 2016-09-30 ENCOUNTER — Telehealth: Payer: Self-pay | Admitting: Family Medicine

## 2016-09-30 NOTE — Telephone Encounter (Signed)
Pt would like to have a call back to discuss what was told to her back in March that was pertaining to her labs.

## 2016-09-30 NOTE — Telephone Encounter (Signed)
I called patient, but unable to leave a voicemail due to voicemail being full.

## 2016-10-02 NOTE — Telephone Encounter (Signed)
I called patient, but unable to leave a voicemail due to voicemail being full.

## 2016-10-06 NOTE — Telephone Encounter (Signed)
I called and spoke with patient. She has been doing the potassium rich diet. She had forgotten about the Magnesium Oxide 250 mg tablet and needed to see what she needed to pick up. Patient informed and patient verbalized understanding.

## 2016-10-06 NOTE — Telephone Encounter (Signed)
Patient had called back to mention that she was having some hives that are itchy and wanted to know what she could use over the counter. I advised patient she could use benadryl and she can also use hydrocortisone cream. She has used the benadryl, so she will try the hydrocortisone cream. If this does not help, she will let us know or come in to be seen.

## 2016-10-07 NOTE — Telephone Encounter (Signed)
I left detailed message for patient on her personal voicemail about information below.

## 2016-10-07 NOTE — Telephone Encounter (Signed)
She can try Zyrtec 10 mg am, Pepcid 20 mg bid, and continue topical OTC Hydrocortisone. Main thing is to avoid trigger factor if she has identified it. If not better or gets worse she needs to be seen.  Thanks, BJ

## 2016-12-09 ENCOUNTER — Encounter: Payer: Self-pay | Admitting: Internal Medicine

## 2017-02-22 ENCOUNTER — Telehealth: Payer: Self-pay | Admitting: Family Medicine

## 2017-02-22 NOTE — Telephone Encounter (Signed)
Copied from [place] [id number]. Topic: Quick Communication - See Telephone Encounter >> Feb 22, 2017  2:54 PM Clack, Laban Emperor wrote: CRM for notification. See Telephone encounter for: Pt would like to come in to have her current "blood lead level" tested and to have a TB test. If the order can be put in to have her blood lead level tested please contact pt to sch appt for both test.  02/22/17.

## 2017-02-23 NOTE — Telephone Encounter (Signed)
Cost for lab is $97 OOP plus lab draw fee.

## 2017-02-23 NOTE — Telephone Encounter (Signed)
I am not sure which Dx to use. If employer is requesting it, I would think she should have an order from them.  Still lab can be ordered as she requests but she needs to know health insurance may not pay for test.  Thanks, BJ

## 2017-02-23 NOTE — Telephone Encounter (Signed)
Lead in blood is not frequently ordered in adults as screening. Is there any suspicious of lead exposure/toxicity?  Her insurance most likely will not cover this lab.  Thanks, BJ

## 2017-02-23 NOTE — Telephone Encounter (Signed)
Pt notified of results/instructions and verbalized understanding. She will contact potential employer tomorrow to inquire if they will pay for testing, or if they can order it themselves.  She would like to know OOP cost if she has lead testing done here and insurance does not cover. Alexis Stout, can you check into cost of testing please?

## 2017-02-23 NOTE — Telephone Encounter (Signed)
Pt states the blood lead level test is needed for her job.

## 2017-02-24 ENCOUNTER — Other Ambulatory Visit: Payer: Self-pay | Admitting: Family Medicine

## 2017-02-24 DIAGNOSIS — Z0189 Encounter for other specified special examinations: Secondary | ICD-10-CM

## 2017-02-24 NOTE — Telephone Encounter (Signed)
Spoke with patient, patient aware of cost. Patient wants to come in on 03/03/17 for lab and TB test.

## 2017-02-24 NOTE — Telephone Encounter (Signed)
Lead order placed.  Thanks, BJ

## 2017-03-03 ENCOUNTER — Ambulatory Visit (INDEPENDENT_AMBULATORY_CARE_PROVIDER_SITE_OTHER): Payer: No Typology Code available for payment source | Admitting: Family Medicine

## 2017-03-03 DIAGNOSIS — Z23 Encounter for immunization: Secondary | ICD-10-CM | POA: Diagnosis not present

## 2017-03-03 DIAGNOSIS — Z0189 Encounter for other specified special examinations: Secondary | ICD-10-CM

## 2017-03-05 ENCOUNTER — Encounter: Payer: Self-pay | Admitting: *Deleted

## 2017-03-05 LAB — TB SKIN TEST
Induration: 0 mm
TB SKIN TEST: NEGATIVE

## 2017-03-05 LAB — LEAD, BLOOD (ADULT >= 16 YRS): LEAD: 2 ug/dL (ref <5)

## 2017-03-09 ENCOUNTER — Encounter: Payer: Self-pay | Admitting: *Deleted

## 2017-04-05 ENCOUNTER — Other Ambulatory Visit: Payer: Self-pay | Admitting: Family Medicine

## 2017-04-05 DIAGNOSIS — I10 Essential (primary) hypertension: Secondary | ICD-10-CM

## 2017-04-21 ENCOUNTER — Encounter (INDEPENDENT_AMBULATORY_CARE_PROVIDER_SITE_OTHER): Payer: Self-pay | Admitting: Orthopaedic Surgery

## 2017-04-21 ENCOUNTER — Ambulatory Visit (INDEPENDENT_AMBULATORY_CARE_PROVIDER_SITE_OTHER): Payer: No Typology Code available for payment source | Admitting: Orthopaedic Surgery

## 2017-04-21 ENCOUNTER — Ambulatory Visit (INDEPENDENT_AMBULATORY_CARE_PROVIDER_SITE_OTHER): Payer: No Typology Code available for payment source

## 2017-04-21 VITALS — BP 128/68 | HR 66

## 2017-04-21 DIAGNOSIS — R200 Anesthesia of skin: Secondary | ICD-10-CM | POA: Diagnosis not present

## 2017-04-21 DIAGNOSIS — M25512 Pain in left shoulder: Secondary | ICD-10-CM

## 2017-04-21 DIAGNOSIS — G8929 Other chronic pain: Secondary | ICD-10-CM | POA: Diagnosis not present

## 2017-04-21 MED ORDER — LIDOCAINE HCL 1 % IJ SOLN
0.5000 mL | INTRAMUSCULAR | Status: AC | PRN
  Administered 2017-04-21: .5 mL

## 2017-04-21 MED ORDER — METHYLPREDNISOLONE ACETATE 40 MG/ML IJ SUSP
40.0000 mg | INTRAMUSCULAR | Status: AC | PRN
  Administered 2017-04-21: 40 mg via INTRA_ARTICULAR

## 2017-04-21 MED ORDER — BUPIVACAINE HCL 0.25 % IJ SOLN
4.0000 mL | INTRAMUSCULAR | Status: AC | PRN
  Administered 2017-04-21: 4 mL via INTRA_ARTICULAR

## 2017-04-21 NOTE — Progress Notes (Signed)
Office Visit Note   Patient: Alexis Stout           Date of Birth: 01-22-1955           MRN: [id number] Visit Date: 04/21/2017              Requested by: Martinique, Betty G, MD [address] [place], [place] [address] PCP: Martinique, Betty G, MD   Assessment & Plan: Visit Diagnoses:  1. Chronic left shoulder pain   2. Acute pain of left shoulder   3. Bilateral hand numbness     Plan: Subacromial injection performed left shoulder.  She can return in 3 weeks and on return we will obtain AP lateral C-spine x-rays. Follow-Up Instructions: Return in about 3 weeks (around 05/12/2017).   Orders:  Orders Placed This Encounter  Procedures  . Large Joint Inj: L subacromial bursa  . XR Shoulder Left   No orders of the defined types were placed in this encounter.     Procedures: Large Joint Inj: L subacromial bursa on 04/21/2017 9:35 AM Indications: pain Details: 22 G 1.5 in needle  Arthrogram: No  Medications: 4 mL bupivacaine 0.25 %; 40 mg methylPREDNISolone acetate 40 MG/ML; 0.5 mL lidocaine 1 % Outcome: tolerated well, no immediate complications Procedure, treatment alternatives, risks and benefits explained, specific risks discussed. Consent was given by the patient. Immediately prior to procedure a time out was called to verify the correct patient, procedure, equipment, support staff and site/side marked as required. Patient was prepped and draped in the usual sterile fashion.       Clinical Data: No additional findings.   Subjective: Chief Complaint  Patient presents with  . Left Shoulder - Pain    HPI 63 year old female last seen 2015 is here with problems with left shoulder pain and bilateral hand numbness.  She has splint she wears at night and if she does not wear splint she has to wake up and shake her hands.  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  She is had pain with left shoulder outstretched reaching and  overhead activities.  She points laterally to the deltoid insertion site where she feels the pain.  No past history of injury to her shoulder she denies associated neck pain.  More symptoms on the left than right hand at night which she states she thinks she has carpal tunnel syndrome.  Review of Systems positive for hyperlipidemia, hypertension.  History of abdominal pain.  Bilateral hand numbness.  14 point review of systems otherwise negative as it pertains HPI.   Objective: Vital Signs: BP 128/68   Pulse 66   Physical Exam  Constitutional: She is oriented to person, place, and time. She appears well-developed.  HENT:  Head: Normocephalic.  Right Ear: External ear normal.  Left Ear: External ear normal.  Eyes: Pupils are equal, round, and reactive to light.  Neck: No tracheal deviation present. No thyromegaly present.  Cardiovascular: Normal rate.  Pulmonary/Chest: Effort normal.  Abdominal: Soft.  Neurological: She is alert and oriented to person, place, and time.  Skin: Skin is warm and dry.  Psychiatric: She has a normal mood and affect. Her behavior is normal.    Ortho Exam patient has positive impingement left shoulder.  Brachial plexus tenderness on the left not on the right.  Positive Spurling on the right.  Upper extremity reflexes are 2+ and symmetrical.  No thenar atrophy slight thumb abduction weakness on the left.  Decreased sensation radial 3-1/2 fingers left and right.  Cubital tunnel ulnar nerve is normal.  Specialty Comments:  No specialty comments available.  Imaging: Xr Shoulder Left  Result Date: 04/21/2017 Three-view x-rays left shoulder obtained and reviewed.  This shows normal glenohumeral joint.  Negative for acute changes. Impression: Normal left shoulder x-rays.    PMFS History: Patient Active Problem List   Diagnosis Date Noted  . Generalized osteoarthritis of multiple sites 03/29/2016  . Abdominal pain, chronic, right lower quadrant 08/30/2013    . Hyperlipidemia, mixed 05/21/2009  . Essential hypertension 09/09/2006   Past Medical History:  Diagnosis Date  . Arthritis   . GERD (gastroesophageal reflux disease)   . Hypertension     Family History  Problem Relation Age of Onset  . Heart attack Father   . Ovarian cancer Mother   . Breast cancer Sister   . Diabetes Sister        x 3    Past Surgical History:  Procedure Laterality Date  . ABDOMINAL HYSTERECTOMY    . ROTATOR CUFF REPAIR Right    Social History   Occupational History  . Occupation: property mgmt  Tobacco Use  . Smoking status: Never Smoker  . Smokeless tobacco: Never Used  Substance and Sexual Activity  . Alcohol use: Yes    Alcohol/week: 0.0 oz    Types: 1 - 2 Glasses of wine per week    Comment: 1-2 glasses every after noon  . Drug use: Yes    Types: Marijuana  . Sexual activity: Never    Comment: socially

## 2017-05-12 ENCOUNTER — Encounter (INDEPENDENT_AMBULATORY_CARE_PROVIDER_SITE_OTHER): Payer: Self-pay | Admitting: Orthopaedic Surgery

## 2017-05-12 ENCOUNTER — Ambulatory Visit (INDEPENDENT_AMBULATORY_CARE_PROVIDER_SITE_OTHER): Payer: No Typology Code available for payment source | Admitting: Orthopaedic Surgery

## 2017-05-12 VITALS — BP 161/82 | HR 80 | Ht 64.0 in | Wt 165.0 lb

## 2017-05-12 DIAGNOSIS — M25512 Pain in left shoulder: Secondary | ICD-10-CM | POA: Diagnosis not present

## 2017-05-12 NOTE — Progress Notes (Signed)
Office Visit Note   Patient: Alexis Stout           Date of Birth: 1955/07/12           MRN: [id number] Visit Date: 05/12/2017              Requested by: Martinique, Betty G, MD [address] [place], [place] [address] PCP: Martinique, Betty G, MD   Assessment & Plan: Visit Diagnoses:  1. Left shoulder pain, unspecified chronicity     Plan: Patient is back to near full activity.  She is gotten good relief the injection.  If she has recurrence of symptoms and increased neck pain she can return and we can obtain cervical spine x-rays and repeat her exam.  Currently she is very happy with the results of treatment and is gotten good pain relief.  Follow-Up Instructions: Return if symptoms worsen or fail to improve.   Orders:  No orders of the defined types were placed in this encounter.  No orders of the defined types were placed in this encounter.     Procedures: No procedures performed   Clinical Data: No additional findings.   Subjective: Chief Complaint  Patient presents with  . Left Shoulder - Follow-up, Pain    HPI 62 year old female returns post injection left shoulder 04/21/2017.  Plan was to obtain cervical spine x-rays but she states she is doing great her shoulder does not hurt she has good range of motion and only has some minimal discomfort on the  left side of her neck.  She is back doing remodeling activities.  She notices that if she wears a backpack at that heavy she does have some neck and shoulder pain but otherwise she has been asymptomatic.  She is sleeping well no chills or fever no numbness or tingling in her hands no gait disturbance.  Review of Systems updated unchanged from 04/21/2017 other than as mentioned in HPI.   Objective: Vital Signs: BP (!) 161/82   Pulse 80   Ht 5\' 4"  (1.626 m)   Wt 165 lb (74.8 kg)   BMI 28.32 kg/m   Physical Exam  Constitutional: She is oriented to person, place, and time. She appears well-developed.  HENT:    Head: Normocephalic.  Right Ear: External ear normal.  Left Ear: External ear normal.  Eyes: Pupils are equal, round, and reactive to light.  Neck: No tracheal deviation present. No thyromegaly present.  Cardiovascular: Normal rate.  Pulmonary/Chest: Effort normal.  Abdominal: Soft.  Neurological: She is alert and oriented to person, place, and time.  Skin: Skin is warm and dry.  Psychiatric: She has a normal mood and affect. Her behavior is normal.    Ortho Exam Patient has minimal left brachial plexus tenderness negative Spurling.  Full range of motion of the shoulder negative impingement negative drop arm test.  Negative crossarm abduction.  Upper extremity reflexes are 1+ and symmetrical.  No lower extremity clonus normal heel toe gait.  No isolated motor weakness.  No thenar atrophy.    Specialty Comments:  No specialty comments available.  Imaging: No results found.   PMFS History: Patient Active Problem List   Diagnosis Date Noted  . Generalized osteoarthritis of multiple sites 03/29/2016  . Abdominal pain, chronic, right lower quadrant 08/30/2013  . Hyperlipidemia, mixed 05/21/2009  . Essential hypertension 09/09/2006   Past Medical History:  Diagnosis Date  . Arthritis   . GERD (gastroesophageal reflux disease)   . Hypertension  Family History  Problem Relation Age of Onset  . Heart attack Father   . Ovarian cancer Mother   . Breast cancer Sister   . Diabetes Sister        x 3    Past Surgical History:  Procedure Laterality Date  . ABDOMINAL HYSTERECTOMY    . ROTATOR CUFF REPAIR Right    Social History   Occupational History  . Occupation: property mgmt  Tobacco Use  . Smoking status: Never Smoker  . Smokeless tobacco: Never Used  Substance and Sexual Activity  . Alcohol use: Yes    Alcohol/week: 0.0 oz    Types: 1 - 2 Glasses of wine per week    Comment: 1-2 glasses every after noon  . Drug use: Yes    Types: Marijuana  . Sexual activity:  Never    Comment: socially

## 2017-05-14 ENCOUNTER — Other Ambulatory Visit: Payer: Self-pay | Admitting: Family Medicine

## 2017-05-14 ENCOUNTER — Telehealth: Payer: Self-pay | Admitting: Family Medicine

## 2017-05-14 DIAGNOSIS — I10 Essential (primary) hypertension: Secondary | ICD-10-CM

## 2017-05-14 MED ORDER — ATENOLOL-CHLORTHALIDONE 50-25 MG PO TABS
1.0000 | ORAL_TABLET | Freq: Every day | ORAL | 0 refills | Status: DC
Start: 2017-05-14 — End: 2017-07-04

## 2017-05-14 NOTE — Telephone Encounter (Signed)
Informed pt that she needed to have office visit scheduled for additional refills. Appt scheduled for 5/15 with Dr. Martinique. 30-day refill of Tenoretic sent to requested pharmacy.

## 2017-05-14 NOTE — Telephone Encounter (Signed)
Copied from [place] [id number] [id number]. Topic: Quick Communication - Rx Refill/Question >> May 14, 2017  5:55 PM Mcneil, Ja-Kwan wrote: Medication: atenolol (TENORMIN) 50 MG tablet   Preferred Pharmacy (with phone number or street name): [id number] [place] [address] [place]. [phone] (Phone) [phone] (Fax)  Agent: Please be advised that RX refills may take up to 3 business days. We ask that you follow-up with your pharmacy.

## 2017-05-18 NOTE — Progress Notes (Signed)
HPI:   [name] is a 62 y.o. female, who is here today for follow up.   she was last seen on 03/30/16.   Hyperlipidemia:  Currently on Zocor 20 mg daily. Following a low fat diet: Yes.  She has not noted side effects with medication.  Lab Results  Component Value Date   CHOL 256 (H) 03/30/2016   HDL 40.30 03/30/2016   LDLCALC 188 (H) 03/30/2016   LDLDIRECT 179.9 05/21/2009   TRIG 138.0 03/30/2016   CHOLHDL 6 03/30/2016    Hypertension  Currently she is on chlorthalidone 25 mg daily and atenolol 50 mg daily.  Lab Results  Component Value Date   CREATININE 0.82 03/30/2016   BUN 15 03/30/2016   NA 142 03/30/2016   K 3.1 (L) 03/30/2016   CL 103 03/30/2016   CO2 32 03/30/2016    Last eye exam:over a year.   Home BP: she does  Not check BP frequently.  She is "pretty active" but does exercise regularly.  On Mobic 7.5 mg as needed,has not taken it for long time for left hip pain.  She is also having some achy pain in the left knee. She does Architect work, which entails walking and stairs, prolonged standing and walking. No recent trauma.   Review of Systems  Constitutional: Negative for activity change, appetite change, fatigue, fever and unexpected weight change.  HENT: Negative for mouth sores, nosebleeds and trouble swallowing.   Eyes: Negative for redness and visual disturbance.  Respiratory: Negative for cough, shortness of breath and wheezing.   Cardiovascular: Negative for chest pain, palpitations and leg swelling.  Gastrointestinal: Negative for abdominal pain, nausea and vomiting.       Negative for changes in bowel habits.  Genitourinary: Negative for decreased urine volume and hematuria.  Musculoskeletal: Positive for arthralgias. Negative for gait problem and myalgias.  Skin: Negative for rash.  Neurological: Negative for syncope, weakness and headaches.     Current Outpatient Medications on File Prior to Visit  Medication  Sig Dispense Refill  . atenolol-chlorthalidone (TENORETIC) 50-25 MG tablet Take 1 tablet by mouth daily. 30 tablet 0  . simvastatin (ZOCOR) 20 MG tablet Take 1 tablet (20 mg total) by mouth at bedtime. 90 tablet 3   No current facility-administered medications on file prior to visit.      Past Medical History:  Diagnosis Date  . Arthritis   . GERD (gastroesophageal reflux disease)   . Hypertension    No Known Allergies  Social History   Socioeconomic History  . Marital status: Divorced    Spouse name: Not on file  . Number of children: 1  . Years of education: Not on file  . Highest education level: Not on file  Occupational History  . Occupation: Therapist, nutritional  Social Needs  . Financial resource strain: Not on file  . Food insecurity:    Worry: Not on file    Inability: Not on file  . Transportation needs:    Medical: Not on file    Non-medical: Not on file  Tobacco Use  . Smoking status: Never Smoker  . Smokeless tobacco: Never Used  Substance and Sexual Activity  . Alcohol use: Yes    Alcohol/week: 0.0 oz    Types: 1 - 2 Glasses of wine per week    Comment: 1-2 glasses every after noon  . Drug use: Yes    Types: Marijuana  . Sexual activity: Never    Comment: socially  Lifestyle  . Physical activity:    Days per week: Not on file    Minutes per session: Not on file  . Stress: Not on file  Relationships  . Social connections:    Talks on phone: Not on file    Gets together: Not on file    Attends religious service: Not on file    Active member of club or organization: Not on file    Attends meetings of clubs or organizations: Not on file    Relationship status: Not on file  Other Topics Concern  . Not on file  Social History Narrative  . Not on file    Vitals:   05/19/17 0741  BP: 126/80  Pulse: 70  Resp: 12  Temp: 98.5 F (36.9 C)  SpO2: 98%   Body mass index is 27.72 kg/m.    Physical Exam  Nursing note and vitals  reviewed. Constitutional: She is oriented to person, place, and time. She appears well-developed. No distress.  HENT:  Head: Normocephalic and atraumatic.  Mouth/Throat: Oropharynx is clear and moist and mucous membranes are normal.  Eyes: Pupils are equal, round, and reactive to light. Conjunctivae are normal.  Neck: No thyroid mass present.  Cardiovascular: Normal rate and regular rhythm.  No murmur heard. Pulses:      Dorsalis pedis pulses are 2+ on the right side, and 2+ on the left side.  Respiratory: Effort normal and breath sounds normal. No respiratory distress.  GI: Soft. She exhibits no mass. There is no hepatomegaly. There is no tenderness.  Musculoskeletal: She exhibits no edema.  Left knee crepitus. Left hip and knee range of motion in normal range, it does not elicit pain.  Lymphadenopathy:    She has no cervical adenopathy.  Neurological: She is alert and oriented to person, place, and time. She has normal strength. Coordination normal.  Skin: Skin is warm. No erythema.  Psychiatric: She has a normal mood and affect.  Well groomed, good eye contact.      ASSESSMENT AND PLAN:   [name] was seen today for follow-up.  Orders Placed This Encounter  Procedures  . Comprehensive metabolic panel  . Lipid panel    Lab Results  Component Value Date   ALT 16 05/19/2017   AST 12 05/19/2017   ALKPHOS 86 05/19/2017   BILITOT 0.4 05/19/2017   Lab Results  Component Value Date   CREATININE 0.84 05/19/2017   BUN 17 05/19/2017   NA 139 05/19/2017   K 4.0 05/19/2017   CL 101 05/19/2017   CO2 29 05/19/2017   Lab Results  Component Value Date   CHOL 282 (H) 05/19/2017   HDL 58.70 05/19/2017   LDLCALC 202 (H) 05/19/2017   LDLDIRECT 179.9 05/21/2009   TRIG 106.0 05/19/2017   CHOLHDL 5 05/19/2017    Generalized osteoarthritis of multiple sites Continue taking Meloxicam 7.5 mg daily as needed, which has helped with pain. We discussed some side effects  of chronic use of NSAIDs.   Essential hypertension Adequately controlled. No changes in current management. DASH-Low-salt diet recommended. Eye exam recommended annually. F/U in 6 months, before if needed.   Hyperlipidemia, mixed No changes in Zocor 20 mg daily. Continue low-fat diet. Further recommendations will be given according to lipid panel results.   Hyperkalemia  Further recommendations according to BMP. Side effects of Chlorthalidone discussed.       Betty G. Martinique, MD  [facility]. [place] office.

## 2017-05-19 ENCOUNTER — Ambulatory Visit (INDEPENDENT_AMBULATORY_CARE_PROVIDER_SITE_OTHER): Payer: No Typology Code available for payment source | Admitting: Family Medicine

## 2017-05-19 ENCOUNTER — Encounter: Payer: Self-pay | Admitting: Family Medicine

## 2017-05-19 VITALS — BP 126/80 | HR 70 | Temp 98.5°F | Resp 12 | Ht 64.0 in | Wt 161.5 lb

## 2017-05-19 DIAGNOSIS — E875 Hyperkalemia: Secondary | ICD-10-CM

## 2017-05-19 DIAGNOSIS — I10 Essential (primary) hypertension: Secondary | ICD-10-CM | POA: Diagnosis not present

## 2017-05-19 DIAGNOSIS — M159 Polyosteoarthritis, unspecified: Secondary | ICD-10-CM | POA: Diagnosis not present

## 2017-05-19 DIAGNOSIS — E782 Mixed hyperlipidemia: Secondary | ICD-10-CM | POA: Diagnosis not present

## 2017-05-19 LAB — COMPREHENSIVE METABOLIC PANEL
ALBUMIN: 4.2 g/dL (ref 3.5–5.2)
ALK PHOS: 86 U/L (ref 39–117)
ALT: 16 U/L (ref 0–35)
AST: 12 U/L (ref 0–37)
BUN: 17 mg/dL (ref 6–23)
CALCIUM: 9.5 mg/dL (ref 8.4–10.5)
CO2: 29 mEq/L (ref 19–32)
CREATININE: 0.84 mg/dL (ref 0.40–1.20)
Chloride: 101 mEq/L (ref 96–112)
GFR: 87.92 mL/min (ref >60.00)
Glucose, Bld: 106 mg/dL — ABNORMAL HIGH (ref 70–99)
POTASSIUM: 4 meq/L (ref 3.5–5.1)
SODIUM: 139 meq/L (ref 135–145)
TOTAL PROTEIN: 7.2 g/dL (ref 6.0–8.3)
Total Bilirubin: 0.4 mg/dL (ref 0.2–1.2)

## 2017-05-19 LAB — LIPID PANEL
Cholesterol: 282 mg/dL — ABNORMAL HIGH (ref 0–200)
HDL: 58.7 mg/dL (ref >39.00)
LDL Cholesterol: 202 mg/dL — ABNORMAL HIGH (ref 0–99)
NonHDL: 223.07
TRIGLYCERIDES: 106 mg/dL (ref 0.0–149.0)
Total CHOL/HDL Ratio: 5
VLDL: 21.2 mg/dL (ref 0.0–40.0)

## 2017-05-19 MED ORDER — MELOXICAM 7.5 MG PO TABS: 7.5000 mg | ORAL_TABLET | Freq: Every day | ORAL | 2 refills | Status: DC | PRN

## 2017-05-19 NOTE — Assessment & Plan Note (Signed)
Continue taking Meloxicam 7.5 mg daily as needed, which has helped with pain. We discussed some side effects of chronic use of NSAIDs.

## 2017-05-19 NOTE — Assessment & Plan Note (Signed)
Adequately controlled. No changes in current management. DASH-Low-salt diet recommended. Eye exam recommended annually. F/U in 6 months, before if needed.

## 2017-05-19 NOTE — Patient Instructions (Signed)
A few things to remember from today's visit:   Essential hypertension - Plan: Comprehensive metabolic panel  Hyperlipidemia, mixed - Plan: Comprehensive metabolic panel, Lipid panel  Hyperkalemia  No changes today. We need an eye exam.    Please be sure medication list is accurate. If a new problem present, please set up appointment sooner than planned today.

## 2017-05-19 NOTE — Assessment & Plan Note (Signed)
No changes in Zocor 20 mg daily. Continue low-fat diet. Further recommendations will be given according to lipid panel results.

## 2017-06-03 ENCOUNTER — Other Ambulatory Visit: Payer: Self-pay | Admitting: Family Medicine

## 2017-06-03 DIAGNOSIS — Z1231 Encounter for screening mammogram for malignant neoplasm of breast: Secondary | ICD-10-CM

## 2017-06-10 ENCOUNTER — Ambulatory Visit: Payer: Self-pay | Admitting: Family Medicine

## 2017-06-10 NOTE — Telephone Encounter (Signed)
Pt c/o SOB with exertion. She stated that the issue has been going on for 6 months and stated she forgot to tell the doctor at her last office visit.  Pt states the SOB is with exertion and she wants a chest xray. Pt is not having SOB at rest and stated the SOB that she is having is intermittent and has been occurring for 6 months.   Pt refused rest of triage questions and stated "I just want an appointment." Pt given appt for 06/15/17 @ 0900.   Reason for Disposition . [1] MILD longstanding difficulty breathing AND [2]  SAME as normal    Pt talking in full sentences states mild SOB with exertion has been present for 6 months but "comes and goes"  Answer Assessment - Initial Assessment Questions 1. RESPIRATORY STATUS: "Describe your breathing?" (e.g., wheezing, shortness of breath, unable to speak, severe coughing)      Shortness of breath with exertion 2. ONSET: "When did this breathing problem begin?"      6 months ago 3. PATTERN "Does the difficult breathing come and go, or has it been constant since it started?"      Comes and goes 4. SEVERITY: "How bad is your breathing?" (e.g., mild, moderate, severe)    - MILD: No SOB at rest, mild SOB with walking, speaks normally in sentences, can lay down, no retractions, pulse < 100.    - MODERATE: SOB at rest, SOB with minimal exertion and prefers to sit, cannot lie down flat, speaks in phrases, mild retractions, audible wheezing, pulse 100-120.    - SEVERE: Very SOB at rest, speaks in single words, struggling to breathe, sitting hunched forward, retractions, pulse > 120      mild 5. RECURRENT SYMPTOM: "Have you had difficulty breathing before?" If so, ask: "When was the last time?" and "What happened that time?"      no 6. CARDIAC HISTORY: "Do you have any history of heart disease?" (e.g., heart attack, angina, bypass surgery, angioplasty)      Has h/o heart murmur when younger 7. LUNG HISTORY: "Do you have any history of lung disease?"  (e.g.,  pulmonary embolus, asthma, emphysema)     no 8. CAUSE: "What do you think is causing the breathing problem?"      Pt feels like she needs an xray Pt doesn't know what  9. OTHER SYMPTOMS: "Do you have any other symptoms? (e.g., dizziness, runny nose, cough, chest pain, fever)     Pt refused triage at this point  Protocols used: BREATHING DIFFICULTY-A-AH

## 2017-06-15 ENCOUNTER — Ambulatory Visit (INDEPENDENT_AMBULATORY_CARE_PROVIDER_SITE_OTHER): Payer: No Typology Code available for payment source | Admitting: Family Medicine

## 2017-06-15 ENCOUNTER — Encounter: Payer: Self-pay | Admitting: Family Medicine

## 2017-06-15 VITALS — BP 124/83 | HR 63 | Temp 98.1°F | Resp 12 | Ht 64.0 in | Wt 158.1 lb

## 2017-06-15 DIAGNOSIS — R0609 Other forms of dyspnea: Secondary | ICD-10-CM

## 2017-06-15 DIAGNOSIS — R12 Heartburn: Secondary | ICD-10-CM | POA: Diagnosis not present

## 2017-06-15 DIAGNOSIS — R002 Palpitations: Secondary | ICD-10-CM | POA: Diagnosis not present

## 2017-06-15 NOTE — Assessment & Plan Note (Signed)
Chronic. No associated symptoms. She feels like is a stable and for now she is not interested in cardiology referral. Instructed about warning signs.

## 2017-06-15 NOTE — Progress Notes (Signed)
ACUTE VISIT   HPI:  Chief Complaint  Patient presents with  . Shortness of Breath    started 6 months ago  . Sore Throat    started 1 week ago, getting better, still hurts.  . Fatigue    Alexis Stout is a 62 y.o. female, who is here today complaining of exertional dyspnea, according to [facility] triage note it has been going on for 6 months, she states that she might have exaggerated.  She is not sure about duration, it has been stable. She is not concerned about heart disease. She would like to have chest x-ray.  Problem is intermittent, she is not sure about what activities exacerbate problem. Alleviated factors not reported. No associated chest pain, diaphoresis, cough, or wheezing.   She has occasional palpitation, which has been going on for a while. Usually is not related with exertion.   She has not identified exacerbating or alleviating factors. No associated dizziness or syncope.  Hx of HLD and HTN. EKG in 03/2016 was done because c/o palpitations,otherwise normal.  She had some heartburn that lasted 2 to 3 days, resolved with OTC medication, she does not remember the name.  Denies abdominal pain, nausea, vomiting, changes in bowel habits, blood in stool or melena.  She is also concerned about a week of sore throat and fatigue, symptoms are improving. No sick contacts or recent travel. No chills, fever, oral lesions, stridor, or dysphasia.    Review of Systems  Constitutional: Positive for activity change and fatigue. Negative for appetite change, diaphoresis and fever.  HENT: Positive for sore throat. Negative for congestion, mouth sores, postnasal drip, sinus pressure, trouble swallowing and voice change.   Respiratory: Positive for shortness of breath. Negative for cough and wheezing.   Cardiovascular: Positive for palpitations. Negative for chest pain and leg swelling.  Gastrointestinal: Negative for abdominal pain, diarrhea, nausea and vomiting.    Musculoskeletal: Negative for gait problem and myalgias.  Skin: Negative for rash.  Allergic/Immunologic: Negative for environmental allergies.  Neurological: Negative for syncope, weakness and headaches.  Hematological: Negative for adenopathy. Does not bruise/bleed easily.  Psychiatric/Behavioral: Negative for confusion. The patient is nervous/anxious.       Current Outpatient Medications on File Prior to Visit  Medication Sig Dispense Refill  . atenolol-chlorthalidone (TENORETIC) 50-25 MG tablet Take 1 tablet by mouth daily. 30 tablet 0  . meloxicam (MOBIC) 7.5 MG tablet Take 1 tablet (7.5 mg total) by mouth daily as needed for pain. 30 tablet 2  . simvastatin (ZOCOR) 20 MG tablet Take 1 tablet (20 mg total) by mouth at bedtime. (Patient taking differently: Take 20 mg by mouth daily. ) 90 tablet 3   No current facility-administered medications on file prior to visit.      Past Medical History:  Diagnosis Date  . Arthritis   . GERD (gastroesophageal reflux disease)   . Hypertension    No Known Allergies  Social History   Socioeconomic History  . Marital status: Divorced    Spouse name: Not on file  . Number of children: 1  . Years of education: Not on file  . Highest education level: Not on file  Occupational History  . Occupation: Therapist, nutritional  Social Needs  . Financial resource strain: Not on file  . Food insecurity:    Worry: Not on file    Inability: Not on file  . Transportation needs:    Medical: Not on file    Non-medical: Not on  file  Tobacco Use  . Smoking status: Never Smoker  . Smokeless tobacco: Never Used  Substance and Sexual Activity  . Alcohol use: Yes    Alcohol/week: 0.0 oz    Types: 1 - 2 Glasses of wine per week    Comment: 1-2 glasses every after noon  . Drug use: Yes    Types: Marijuana  . Sexual activity: Never    Comment: socially  Lifestyle  . Physical activity:    Days per week: Not on file    Minutes per session: Not on file   . Stress: Not on file  Relationships  . Social connections:    Talks on phone: Not on file    Gets together: Not on file    Attends religious service: Not on file    Active member of club or organization: Not on file    Attends meetings of clubs or organizations: Not on file    Relationship status: Not on file  Other Topics Concern  . Not on file  Social History Narrative  . Not on file    Vitals:   06/15/17 0922  BP: 124/83  Pulse: 63  Resp: 12  Temp: 98.1 F (36.7 C)  SpO2: 97%   Body mass index is 27.14 kg/m.   Physical Exam  Nursing note and vitals reviewed. Constitutional: She is oriented to person, place, and time. She appears well-developed. No distress.  HENT:  Head: Normocephalic and atraumatic.  Mouth/Throat: Oropharynx is clear and moist and mucous membranes are normal.  Eyes: Pupils are equal, round, and reactive to light. Conjunctivae are normal.  Neck: No tracheal deviation present. No thyroid mass and no thyromegaly present.  Cardiovascular: Normal rate and regular rhythm.  No murmur heard. Pulses:      Dorsalis pedis pulses are 2+ on the right side, and 2+ on the left side.  Respiratory: Effort normal and breath sounds normal. No respiratory distress.  GI: Soft. She exhibits no mass. There is no hepatomegaly. There is no tenderness.  Musculoskeletal: She exhibits no edema.  Lymphadenopathy:    She has no cervical adenopathy.  Neurological: She is alert and oriented to person, place, and time. She has normal strength. Coordination normal.  Skin: Skin is warm. No rash noted. No erythema.  Psychiatric: Her affect is blunt.  Well groomed, good eye contact.    ASSESSMENT AND PLAN:   Alexis Stout was seen today for shortness of breath, sore throat and fatigue.  Diagnoses and all orders for this visit:  Exertional dyspnea  We discussed possible causes. Examination today does not suggest a serious process.  ?  Deconditioning. Explained that  this could be a symptom of cardiac disease, she is not interested in EKG or cardiology referral for now.  Further recommendation will be given according to imaging results.  -     DG Chest 2 View; Future  Heartburn  Resolved. GERD precautions recommended. If symptoms recur we can try Omeprazole 20 to 40 mg daily for 3 to 4 weeks. Follow-up as needed.  Heart palpitations Chronic. No associated symptoms. She feels like is a stable and for now she is not interested in cardiology referral. Instructed about warning signs.    Return if symptoms worsen or fail to improve.    Mindy Behnken G. Martinique, MD  [facility]. [place] office.

## 2017-06-15 NOTE — Patient Instructions (Addendum)
A few things to remember from today's visit:   Exertional dyspnea - Plan: DG Chest 2 View  Heartburn  If heartburn comes back we can try Omeprazole daily for 3-4 weeks.   Please be sure medication list is accurate. If a new problem present, please set up appointment sooner than planned today.

## 2017-06-23 ENCOUNTER — Ambulatory Visit
Admission: RE | Admit: 2017-06-23 | Discharge: 2017-06-23 | Disposition: A | Discharge status: Home / Self Care | Payer: PRIVATE HEALTH INSURANCE | Source: Ambulatory Visit | Attending: Family Medicine | Admitting: Family Medicine

## 2017-06-23 ENCOUNTER — Other Ambulatory Visit: Payer: Self-pay | Admitting: Family Medicine

## 2017-06-23 DIAGNOSIS — Z1231 Encounter for screening mammogram for malignant neoplasm of breast: Secondary | ICD-10-CM

## 2017-07-04 ENCOUNTER — Other Ambulatory Visit: Payer: Self-pay | Admitting: Family Medicine

## 2017-07-04 DIAGNOSIS — I10 Essential (primary) hypertension: Secondary | ICD-10-CM

## 2017-07-27 ENCOUNTER — Ambulatory Visit (INDEPENDENT_AMBULATORY_CARE_PROVIDER_SITE_OTHER): Payer: No Typology Code available for payment source

## 2017-07-27 ENCOUNTER — Encounter (INDEPENDENT_AMBULATORY_CARE_PROVIDER_SITE_OTHER): Payer: Self-pay | Admitting: Orthopaedic Surgery

## 2017-07-27 ENCOUNTER — Ambulatory Visit (INDEPENDENT_AMBULATORY_CARE_PROVIDER_SITE_OTHER): Payer: No Typology Code available for payment source | Admitting: Orthopaedic Surgery

## 2017-07-27 VITALS — BP 130/70 | HR 68 | Ht 64.0 in | Wt 158.0 lb

## 2017-07-27 DIAGNOSIS — M2012 Hallux valgus (acquired), left foot: Secondary | ICD-10-CM

## 2017-07-27 DIAGNOSIS — M2011 Hallux valgus (acquired), right foot: Secondary | ICD-10-CM | POA: Diagnosis not present

## 2017-07-27 DIAGNOSIS — IMO0002 Reserved for concepts with insufficient information to code with codable children: Secondary | ICD-10-CM | POA: Insufficient documentation

## 2017-07-27 DIAGNOSIS — Z9189 Other specified personal risk factors, not elsewhere classified: Principal | ICD-10-CM

## 2017-07-27 NOTE — Progress Notes (Signed)
Office Visit Note   Patient: Alexis Stout           Date of Birth: 04-20-1955           MRN: [id number] Visit Date: 07/27/2017              Requested by: Martinique, Betty G, MD [address] [place], [place] [address] PCP: Martinique, Betty G, MD   Assessment & Plan: Visit Diagnoses:  1. Toe problem   2. Hallux valgus (acquired), right foot   3. Hallux valgus (acquired), left foot     Plan: Patient would like to proceed with elective bunion surgery.  She does not have increase in her intermetatarsal angle and could have correction with bunionectomy and a chevron distal first metatarsal osteotomy with removal of the small dorsal bunion.  We discussed percutaneous pinning to hold the osteotomy and corrective position.  She likely would not require release of the adductor since it does not appear excessively tight on exam.  We discussed medial capsular plication and use of a wooden shoe postoperatively.  She understands it will take a couple months for the osteotomy to heal she will need to keep her foot elevated.  She has a job where she can sit down a lot of the time and elevate her foot intermittently.  Procedure possible complications discussed including nonunion overcorrection, under correction some continued mild overlap of the second toe over the great toe.  Questions were elicited and answered she understands and requests we proceed.  Follow-Up Instructions: No follow-ups on file.   Orders:  Orders Placed This Encounter  Procedures  . XR Foot Complete Left  . XR Foot Complete Right   No orders of the defined types were placed in this encounter.     Procedures: No procedures performed   Clinical Data: No additional findings.   Subjective: Chief Complaint  Patient presents with  . Foot Problem    toes crossing over each other    HPI 62 year old female returns with ongoing problems with bilateral bunion deformity.  She is tried multiple shoe wear and has problems  with second toe overlap of her great toe.  She has to manually reduce her toe and sometimes has to take her shoe off to fix it.  Tried pads and taping without relief.  Painful medial bunion with shoe wearing she uses sandals during the summertime because of this.  Right foot is slightly worse than the left symptomatically.  Patient states she would like to have this corrected since is bothered her is gradually getting worse over the last 3 years.  Review of Systems positive for hyperlipidemia, hypertension.  History of heart palpitations with normal EKG.  She denies chest pain no dyspnea.   Objective: Vital Signs: BP 130/70   Pulse 68   Ht 5\' 4"  (1.626 m)   Wt 158 lb (71.7 kg)   BMI 27.12 kg/m   Physical Exam  Constitutional: She is oriented to person, place, and time. She appears well-developed.  HENT:  Head: Normocephalic.  Right Ear: External ear normal.  Left Ear: External ear normal.  Eyes: Pupils are equal, round, and reactive to light.  Neck: No tracheal deviation present. No thyromegaly present.  Cardiovascular: Normal rate.  Pulmonary/Chest: Effort normal.  Abdominal: Soft.  Neurological: She is alert and oriented to person, place, and time.  Skin: Skin is warm and dry.  Psychiatric: She has a normal mood and affect. Her behavior is normal.    Ortho Exam  patient has bilateral bunion deformity with hallux valgus of almost 30 degrees with overlap second toe with great toe without cock-up deformity of the second toe.  Capsule of the first MTP joint is lax without significant overtightening of the adductor.  Mild prominent dorsal bunion both feet mildly tender.  Second toe overlap of the great toe bilaterally.  Erythema present over medial bunions from shoe wearing which is tender with small bursa bilaterally.  Good capillary refill.  Normal hip range of motion negative straight leg raising 90 degrees.  Ankle range of motion subtalar motion is normal.  Specialty Comments:  No  specialty comments available.  Imaging: Xr Foot Complete Left  Result Date: 07/27/2017 Three-view x-rays left foot obtained and reviewed.  This shows hallux valgus of 27 degrees with prominent medial exostosis bunion and smaller dorsal bunion.  There is overlap second toe over the first toe.  Intermetatarsal angle first second is less than 10 degrees. Impression: Bunion deformity with hallux valgus second toe overlap over first toe.  Xr Foot Complete Right  Result Date: 07/27/2017 Three-view x-rays right foot demonstrates hallux valgus 27 degrees with prominent medial bunion smaller dorsal bunion first metatarsal head.  Overlapping second toe over great toe with intermetatarsal angle less than 10 degrees.  Some joint space narrowing. Impression: Hallux valgus prominent bunion with second toe overlap over great toe.    PMFS History: Patient Active Problem List   Diagnosis Date Noted  . Toe problem 07/27/2017  . Hallux valgus (acquired), right foot 07/27/2017  . Hallux valgus (acquired), left foot 07/27/2017  . Heart palpitations 06/15/2017  . Generalized osteoarthritis of multiple sites 03/29/2016  . Abdominal pain, chronic, right lower quadrant 08/30/2013  . Hyperlipidemia, mixed 05/21/2009  . Essential hypertension 09/09/2006   Past Medical History:  Diagnosis Date  . Arthritis   . GERD (gastroesophageal reflux disease)   . Hypertension     Family History  Problem Relation Age of Onset  . Heart attack Father   . Ovarian cancer Mother   . Breast cancer Sister 1  . Diabetes Sister        x 3    Past Surgical History:  Procedure Laterality Date  . ABDOMINAL HYSTERECTOMY    . ROTATOR CUFF REPAIR Right    Social History   Occupational History  . Occupation: property mgmt  Tobacco Use  . Smoking status: Never Smoker  . Smokeless tobacco: Never Used  Substance and Sexual Activity  . Alcohol use: Yes    Alcohol/week: 0.6 - 1.2 oz    Types: 1 - 2 Glasses of wine per  week    Comment: 1-2 glasses every after noon  . Drug use: Yes    Types: Marijuana  . Sexual activity: Never    Comment: socially

## 2018-07-13 ENCOUNTER — Other Ambulatory Visit: Payer: Self-pay | Admitting: Family Medicine

## 2018-07-18 ENCOUNTER — Telehealth: Payer: Self-pay | Admitting: *Deleted

## 2018-07-18 NOTE — Telephone Encounter (Signed)
Copied from [place] [id number]. Topic: Appointment Scheduling - Scheduling Inquiry for Clinic >> Jul 18, 2018  7:51 AM Lennox Solders wrote: Reason for CRM: pt is calling and she was at office this morning . Pt does not have a schedule appt. Pt needs physical and medication follow up

## 2018-07-19 NOTE — Telephone Encounter (Signed)
Spoke with patient and she stated that she was supposed to have an appointment on yesterday, she said she was here at the office at 7:30 am, but was not seen. Scheduled patient for 07/20/2018 for in office physical. Okayed per Dr. Martinique.

## 2018-07-20 ENCOUNTER — Encounter: Payer: PRIVATE HEALTH INSURANCE | Admitting: Family Medicine

## 2018-07-20 NOTE — Progress Notes (Deleted)
HPI:   [name] is a 63 y.o. female, who is here today for her routine physical.  Last CPE: 11/2015  Regular exercise 3 or more time per week: *** Following a healthy diet: *** She lives with ***  Chronic medical problems: Hypertension, OA, hyperlipidemia.  Pap smear:, Status post hysterectomy due to fibroids. Hx of abnormal pap smears: Denies. Hx of STD's about 9 to 10 years ago. ***  Immunization History  Administered Date(s) Administered  . Influenza,inj,Quad PF,6+ Mos 08/30/2013  . PPD Test 03/03/2017  . Td 01/05/1998, 05/21/2009    Mammogram: 06/23/2017 BI-RADS 1. Colonoscopy: 10/2013 DEXA: ***  Hep C screening {(if born [birth year])}: ***  She has *** concerns today.  Review of Systems    Current Outpatient Medications on File Prior to Visit  Medication Sig Dispense Refill  . atenolol-chlorthalidone (TENORETIC) 50-25 MG tablet TAKE 1 TABLET BY MOUTH EVERY DAY 90 tablet 3  . meloxicam (MOBIC) 7.5 MG tablet Take 1 tablet (7.5 mg total) by mouth daily as needed for pain. 30 tablet 2  . simvastatin (ZOCOR) 20 MG tablet Take 1 tablet (20 mg total) by mouth daily. 90 tablet 3   No current facility-administered medications on file prior to visit.      Past Medical History:  Diagnosis Date  . Arthritis   . GERD (gastroesophageal reflux disease)   . Hypertension     Past Surgical History:  Procedure Laterality Date  . ABDOMINAL HYSTERECTOMY    . ROTATOR CUFF REPAIR Right     No Known Allergies  Family History  Problem Relation Age of Onset  . Heart attack Father   . Ovarian cancer Mother   . Breast cancer Sister 31  . Diabetes Sister        x 3    Social History   Socioeconomic History  . Marital status: Divorced    Spouse name: Not on file  . Number of children: 1  . Years of education: Not on file  . Highest education level: Not on file  Occupational History  . Occupation: Therapist, nutritional  Social Needs  . Financial resource  strain: Not on file  . Food insecurity    Worry: Not on file    Inability: Not on file  . Transportation needs    Medical: Not on file    Non-medical: Not on file  Tobacco Use  . Smoking status: Never Smoker  . Smokeless tobacco: Never Used  Substance and Sexual Activity  . Alcohol use: Yes    Alcohol/week: 1.0 - 2.0 standard drinks    Types: 1 - 2 Glasses of wine per week    Comment: 1-2 glasses every after noon  . Drug use: Yes    Types: Marijuana  . Sexual activity: Never    Comment: socially  Lifestyle  . Physical activity    Days per week: Not on file    Minutes per session: Not on file  . Stress: Not on file  Relationships  . Social Herbalist on phone: Not on file    Gets together: Not on file    Attends religious service: Not on file    Active member of club or organization: Not on file    Attends meetings of clubs or organizations: Not on file    Relationship status: Not on file  Other Topics Concern  . Not on file  Social History Narrative  . Not on file  There were no vitals filed for this visit. There is no height or weight on file to calculate BMI.   Wt Readings from Last 3 Encounters:  07/27/17 158 lb (71.7 kg)  06/15/17 158 lb 2 oz (71.7 kg)  05/19/17 161 lb 8 oz (73.3 kg)      Physical Exam    ASSESSMENT AND PLAN:  [name] was here today annual physical examination.     No orders of the defined types were placed in this encounter.   There are no diagnoses linked to this encounter.    No problem-specific Assessment & Plan notes found for this encounter.            No follow-ups on file.          Leocadio Heal G. [place], MD  [facility]. [place] office.

## 2018-08-24 ENCOUNTER — Other Ambulatory Visit: Payer: Self-pay

## 2018-08-24 ENCOUNTER — Encounter (HOSPITAL_BASED_OUTPATIENT_CLINIC_OR_DEPARTMENT_OTHER): Payer: Self-pay | Admitting: Emergency Medicine

## 2018-08-24 ENCOUNTER — Emergency Department (HOSPITAL_BASED_OUTPATIENT_CLINIC_OR_DEPARTMENT_OTHER): Payer: No Typology Code available for payment source

## 2018-08-24 ENCOUNTER — Ambulatory Visit (INDEPENDENT_AMBULATORY_CARE_PROVIDER_SITE_OTHER)
Admission: EM | Admit: 2018-08-24 | Discharge: 2018-08-24 | Disposition: A | Discharge status: Home / Self Care | Payer: No Typology Code available for payment source | Source: Home / Self Care

## 2018-08-24 ENCOUNTER — Emergency Department (HOSPITAL_BASED_OUTPATIENT_CLINIC_OR_DEPARTMENT_OTHER)
Admission: EM | Admit: 2018-08-24 | Discharge: 2018-08-24 | Disposition: A | Discharge status: Home / Self Care | Payer: No Typology Code available for payment source | Attending: Emergency Medicine | Admitting: Emergency Medicine

## 2018-08-24 DIAGNOSIS — I10 Essential (primary) hypertension: Secondary | ICD-10-CM | POA: Diagnosis not present

## 2018-08-24 DIAGNOSIS — Z79899 Other long term (current) drug therapy: Secondary | ICD-10-CM | POA: Diagnosis not present

## 2018-08-24 DIAGNOSIS — N201 Calculus of ureter: Secondary | ICD-10-CM | POA: Diagnosis not present

## 2018-08-24 DIAGNOSIS — R1032 Left lower quadrant pain: Secondary | ICD-10-CM | POA: Diagnosis present

## 2018-08-24 DIAGNOSIS — R109 Unspecified abdominal pain: Secondary | ICD-10-CM

## 2018-08-24 LAB — CBC WITH DIFFERENTIAL/PLATELET
Abs Immature Granulocytes: 0.04 10*3/uL (ref 0.00–0.07)
Basophils Absolute: 0 10*3/uL (ref 0.0–0.1)
Basophils Relative: 0 %
Eosinophils Absolute: 0.1 10*3/uL (ref 0.0–0.5)
Eosinophils Relative: 1 %
HCT: 44.9 % (ref 36.0–46.0)
Hemoglobin: 14.2 g/dL (ref 12.0–15.0)
Immature Granulocytes: 0 %
Lymphocytes Relative: 12 %
Lymphs Abs: 1.4 10*3/uL (ref 0.7–4.0)
MCH: 25.5 pg — ABNORMAL LOW (ref 26.0–34.0)
MCHC: 31.6 g/dL (ref 30.0–36.0)
MCV: 80.6 fL (ref 80.0–100.0)
Monocytes Absolute: 0.4 10*3/uL (ref 0.1–1.0)
Monocytes Relative: 4 %
Neutro Abs: 9.5 10*3/uL — ABNORMAL HIGH (ref 1.7–7.7)
Neutrophils Relative %: 83 %
Platelets: 243 10*3/uL (ref 150–400)
RBC: 5.57 MIL/uL — ABNORMAL HIGH (ref 3.87–5.11)
RDW: 13.3 % (ref 11.5–15.5)
WBC: 11.5 10*3/uL — ABNORMAL HIGH (ref 4.0–10.5)
nRBC: 0 % (ref 0.0–0.2)

## 2018-08-24 LAB — URINALYSIS, ROUTINE W REFLEX MICROSCOPIC
Bilirubin Urine: NEGATIVE
Glucose, UA: NEGATIVE mg/dL
Ketones, ur: NEGATIVE mg/dL
Leukocytes,Ua: NEGATIVE
Nitrite: NEGATIVE
Protein, ur: NEGATIVE mg/dL
Specific Gravity, Urine: >1.03 — ABNORMAL HIGH (ref 1.005–1.030)
pH: 5.5 (ref 5.0–8.0)

## 2018-08-24 LAB — COMPREHENSIVE METABOLIC PANEL
ALT: 22 U/L (ref 0–44)
AST: 18 U/L (ref 15–41)
Albumin: 4.4 g/dL (ref 3.5–5.0)
Alkaline Phosphatase: 83 U/L (ref 38–126)
Anion gap: 11 (ref 5–15)
BUN: 22 mg/dL (ref 8–23)
CO2: 27 mmol/L (ref 22–32)
Calcium: 9.8 mg/dL (ref 8.9–10.3)
Chloride: 101 mmol/L (ref 98–111)
Creatinine, Ser: 1.01 mg/dL — ABNORMAL HIGH (ref 0.44–1.00)
GFR calc Af Amer: >60 mL/min (ref >60)
GFR calc non Af Amer: 59 mL/min — ABNORMAL LOW (ref >60)
Glucose, Bld: 123 mg/dL — ABNORMAL HIGH (ref 70–99)
Potassium: 3.5 mmol/L (ref 3.5–5.1)
Sodium: 139 mmol/L (ref 135–145)
Total Bilirubin: 0.5 mg/dL (ref 0.3–1.2)
Total Protein: 7.9 g/dL (ref 6.5–8.1)

## 2018-08-24 LAB — POCT URINALYSIS DIP (MANUAL ENTRY)
Bilirubin, UA: NEGATIVE
Glucose, UA: NEGATIVE mg/dL
Ketones, POC UA: NEGATIVE mg/dL
Leukocytes, UA: NEGATIVE
Nitrite, UA: NEGATIVE
Protein Ur, POC: NEGATIVE mg/dL
Spec Grav, UA: >=1.03 — AB (ref 1.010–1.025)
Urobilinogen, UA: 0.2 E.U./dL
pH, UA: 6 (ref 5.0–8.0)

## 2018-08-24 LAB — URINALYSIS, MICROSCOPIC (REFLEX): WBC, UA: NONE SEEN WBC/hpf (ref 0–5)

## 2018-08-24 LAB — LIPASE, BLOOD: Lipase: 20 U/L (ref 11–51)

## 2018-08-24 MED ORDER — FENTANYL CITRATE (PF) 100 MCG/2ML IJ SOLN
INTRAMUSCULAR | Status: AC
  Administered 2018-08-24: 50 ug via INTRAVENOUS
  Filled 2018-08-24: qty 2

## 2018-08-24 MED ORDER — ONDANSETRON 4 MG PO TBDP
4.0000 mg | ORAL_TABLET | Freq: Once | ORAL | Status: AC
  Administered 2018-08-24: 19:00:00 4 mg via ORAL

## 2018-08-24 MED ORDER — FENTANYL CITRATE (PF) 100 MCG/2ML IJ SOLN
50.0000 ug | INTRAMUSCULAR | Status: DC | PRN
  Administered 2018-08-24: 50 ug via INTRAVENOUS

## 2018-08-24 MED ORDER — ONDANSETRON HCL 4 MG/2ML IJ SOLN
4.0000 mg | Freq: Once | INTRAMUSCULAR | Status: AC
  Administered 2018-08-24: 4 mg via INTRAVENOUS
  Filled 2018-08-24: qty 2

## 2018-08-24 NOTE — ED Notes (Signed)
Patient is being discharged from the Urgent [place] and sent to the Emergency Department via wheelchair by staff. Per Lavell Anchors, NP, patient is stable but in need of higher level of care due to severe LLQ pain. Patient is aware and verbalizes understanding of plan of care.  Vitals:   08/24/18 1856  BP: (!) 155/76  Pulse: 70  Resp: 18  Temp: 97.9 F (36.6 C)  SpO2: 98%

## 2018-08-24 NOTE — ED Triage Notes (Signed)
Pt reports left abd pain started yesterday , nausea, denies urinary symptoms, was seen at [facility], sent to ED for CT to r/o kidney stone or diverticulitis .

## 2018-08-24 NOTE — Discharge Instructions (Addendum)
I am referring you to the emergency department as I suspect you either have a renal stone or you are suffering from diverticulitis. You will require a CT of the abdomen to rule out either acute process.

## 2018-08-24 NOTE — ED Provider Notes (Signed)
[facility]    CSN: [id number] Arrival date & time: 08/24/18  1847      History   Chief Complaint Chief Complaint  Patient presents with  . Abdominal Pain    HPI Alexis Stout is a 63 y.o. female.   HPI  Patient arrived at Urgent Care and projectile vomited in the lobby. She complains of left lower abdominal pain which is been present and gradually worsening since Monday.  In review of EMR patient was diagnosed with diverticulosis back in 2015 and also underwent a colonoscopy in which also showed some mild diverticulosis.  She has been treated for suspected diverticulitis in the past, 2015.  She denies any UTI symptoms. She has not had a fever however had 1 day of persistent diarrhea on Monday which was relieved with Imodium AD. She has remained afebrile. Denies any COVID-19 related symptoms or exposures. She complains of severe left-sided pain which has worsened after she consumed dinner today. Past Medical History:  Diagnosis Date  . Arthritis   . GERD (gastroesophageal reflux disease)   . Hypertension     Patient Active Problem List   Diagnosis Date Noted  . Toe problem 07/27/2017  . Hallux valgus (acquired), right foot 07/27/2017  . Hallux valgus (acquired), left foot 07/27/2017  . Heart palpitations 06/15/2017  . Generalized osteoarthritis of multiple sites 03/29/2016  . Abdominal pain, chronic, right lower quadrant 08/30/2013  . Hyperlipidemia, mixed 05/21/2009  . Essential hypertension 09/09/2006    Past Surgical History:  Procedure Laterality Date  . ABDOMINAL HYSTERECTOMY    . ROTATOR CUFF REPAIR Right     OB History   No obstetric history on file.      Home Medications    Prior to Admission medications   Medication Sig Start Date End Date Taking? Authorizing Provider  atenolol-chlorthalidone (TENORETIC) 50-25 MG tablet TAKE 1 TABLET BY MOUTH EVERY DAY 07/05/17   [place], Betty G, MD  meloxicam (MOBIC) 7.5 MG tablet Take 1 tablet (7.5 mg  total) by mouth daily as needed for pain. 05/19/17   [place], Betty G, MD  simvastatin (ZOCOR) 20 MG tablet Take 1 tablet (20 mg total) by mouth daily. 07/05/17   [place], Betty G, MD    Family History Family History  Problem Relation Age of Onset  . Heart attack Father   . Ovarian cancer Mother   . Breast cancer Sister 67  . Diabetes Sister        x 3    Social History Social History   Tobacco Use  . Smoking status: Never Smoker  . Smokeless tobacco: Never Used  Substance Use Topics  . Alcohol use: Yes    Alcohol/week: 1.0 - 2.0 standard drinks    Types: 1 - 2 Glasses of wine per week    Comment: 1-2 glasses every after noon  . Drug use: Yes    Types: Marijuana     Allergies   Patient has no known allergies.   Review of Systems Review of Systems Pertinent negatives listed in HPI Physical Exam Triage Vital Signs ED Triage Vitals  Enc Vitals Group     BP      Pulse      Resp      Temp      Temp src      SpO2      Weight      Height      Head Circumference      Peak Flow  Pain Score      Pain Loc      Pain Edu?      Excl. in [place]?    No data found.  Updated Vital Signs There were no vitals taken for this visit.  Visual Acuity Right Eye Distance:   Left Eye Distance:   Bilateral Distance:    Right Eye Near:   Left Eye Near:    Bilateral Near:     Physical Exam Constitutional:      General: She is in acute distress.     Appearance: She is ill-appearing.  HENT:     Head: Normocephalic.  Cardiovascular:     Rate and Rhythm: Normal rate.  Pulmonary:     Effort: Pulmonary effort is normal.     Breath sounds: Normal breath sounds.  Abdominal:     General: Bowel sounds are decreased.     Tenderness: There is abdominal tenderness in the suprapubic area and left lower quadrant. There is guarding.       Comments: Exam limited due to degree of patient's tenderness. Projectile vomited clear with food particles.  Neurological:     Mental Status:  She is alert and oriented to person, place, and time.     Coordination: Coordination is intact.       UC Treatments / Results  Labs (all labs ordered are listed, but only abnormal results are displayed) Labs Reviewed - No data to display  EKG   Radiology No results found.  Procedures Procedures (including critical care time)  Medications Ordered in UC Medications - No data to display  Initial Impression / Assessment and Plan / UC Course  I have reviewed the triage vital signs and the nursing notes.  Pertinent labs & imaging results that were available during my care of the patient were reviewed by me and considered in my medical decision making (see chart for details).   Patient presents today with 3 days of left-sided abdominal pain.  She presented to the ER today as pain has gradually worsening. In review of EMR patient has a history of diverticulosis and appears was treated for diverticulitis back in 2015.  Her UA was significant for a gross amount of blood therefore kidney stone could not be ruled out. Patient is accompanied by family and traveling via car to [facility] ER. Patient was given Zofran 4 mg ODT. Patient verbalized understanding.  Final Clinical Impressions(s) / UC Diagnoses   Final diagnoses:  Abdominal pain, left lower quadrant     Discharge Instructions     I am referring you to the emergency department as I suspect you either have a renal stone or you are suffering from diverticulitis. You will require a CT of the abdomen to rule out either acute process.     ED Prescriptions    None     Controlled Substance Prescriptions [place] Controlled Substance Registry consulted? Not Applicable   Scot Jun, FNP 08/24/18 1944

## 2018-08-24 NOTE — ED Provider Notes (Signed)
[facility] Provider Note   CSN: [id number] Arrival date & time: 08/24/18  1946     History   Chief Complaint Chief Complaint  Patient presents with  . Flank Pain    left     HPI Alexis Stout is a 63 y.o. female.  She has no significant past medical history.  She is complaining of some left flank pain and left lower quadrant pain that is been bothering her since last evening.  She said it started mild but increased to an 8 out of 10 and was associated with some nausea and vomiting.  She said her pain is 5 out of 10 now.  She is never had this before.  She said she had some loose stool couple of days ago.  No fevers or chills.  She went to urgent care where they dipped her urine and said there was some blood in it and told her she needed to come here for further evaluation.     The history is provided by the patient.  Flank Pain This is a new problem. The current episode started yesterday. The problem occurs constantly. The problem has not changed since onset.Associated symptoms include abdominal pain. Pertinent negatives include no chest pain, no headaches and no shortness of breath. Nothing aggravates the symptoms. Nothing relieves the symptoms. She has tried nothing for the symptoms. The treatment provided no relief.    Past Medical History:  Diagnosis Date  . Arthritis   . GERD (gastroesophageal reflux disease)   . Hypertension     Patient Active Problem List   Diagnosis Date Noted  . Toe problem 07/27/2017  . Hallux valgus (acquired), right foot 07/27/2017  . Hallux valgus (acquired), left foot 07/27/2017  . Heart palpitations 06/15/2017  . Generalized osteoarthritis of multiple sites 03/29/2016  . Abdominal pain, chronic, right lower quadrant 08/30/2013  . Hyperlipidemia, mixed 05/21/2009  . Essential hypertension 09/09/2006    Past Surgical History:  Procedure Laterality Date  . ABDOMINAL HYSTERECTOMY    . ROTATOR CUFF REPAIR Right       OB History   No obstetric history on file.      Home Medications    Prior to Admission medications   Medication Sig Start Date End Date Taking? Authorizing Provider  atenolol-chlorthalidone (TENORETIC) 50-25 MG tablet TAKE 1 TABLET BY MOUTH EVERY DAY 07/05/17   [place], Betty G, MD    Family History Family History  Problem Relation Age of Onset  . Heart attack Father   . Ovarian cancer Mother   . Breast cancer Sister 52  . Diabetes Sister        x 3    Social History Social History   Tobacco Use  . Smoking status: Never Smoker  . Smokeless tobacco: Never Used  Substance Use Topics  . Alcohol use: Yes    Alcohol/week: 1.0 - 2.0 standard drinks    Types: 1 - 2 Glasses of wine per week    Comment: 1-2 glasses every after noon  . Drug use: Yes    Types: Marijuana     Allergies   Patient has no known allergies.   Review of Systems Review of Systems  Constitutional: Negative for fever.  HENT: Negative for sore throat.   Eyes: Negative for visual disturbance.  Respiratory: Negative for shortness of breath.   Cardiovascular: Negative for chest pain.  Gastrointestinal: Positive for abdominal pain, diarrhea, nausea and vomiting.  Genitourinary: Positive for flank pain. Negative for dysuria.  Musculoskeletal: Negative for neck pain.  Skin: Negative for rash.  Neurological: Negative for headaches.     Physical Exam Updated Vital Signs BP 139/86 (BP Location: Right Arm)   Pulse 73   Temp 97.8 F (36.6 C) (Oral)   Resp 18   Ht 5\' 4"  (1.626 m)   Wt 74.4 kg   SpO2 100%   BMI 28.15 kg/m   Physical Exam Vitals signs and nursing note reviewed.  Constitutional:      General: She is not in acute distress.    Appearance: She is well-developed.  HENT:     Head: Normocephalic and atraumatic.  Eyes:     Conjunctiva/sclera: Conjunctivae normal.  Neck:     Musculoskeletal: Neck supple.  Cardiovascular:     Rate and Rhythm: Normal rate and regular rhythm.      Heart sounds: No murmur.  Pulmonary:     Effort: Pulmonary effort is normal. No respiratory distress.     Breath sounds: Normal breath sounds.  Abdominal:     Palpations: Abdomen is soft.     Tenderness: There is no abdominal tenderness. There is no right CVA tenderness, left CVA tenderness, guarding or rebound.  Musculoskeletal: Normal range of motion.     Right lower leg: No edema.     Left lower leg: No edema.  Skin:    General: Skin is warm and dry.     Capillary Refill: Capillary refill takes less than 2 seconds.  Neurological:     General: No focal deficit present.     Mental Status: She is alert.      ED Treatments / Results  Labs (all labs ordered are listed, but only abnormal results are displayed) Labs Reviewed  URINALYSIS, ROUTINE W REFLEX MICROSCOPIC - Abnormal; Notable for the following components:      Result Value   Specific Gravity, Urine >1.030 (*)    Hgb urine dipstick LARGE (*)    All other components within normal limits  URINALYSIS, MICROSCOPIC (REFLEX) - Abnormal; Notable for the following components:   Bacteria, UA RARE (*)    All other components within normal limits  COMPREHENSIVE METABOLIC PANEL - Abnormal; Notable for the following components:   Glucose, Bld 123 (*)    Creatinine, Ser 1.01 (*)    GFR calc non Af Amer 59 (*)    All other components within normal limits  CBC WITH DIFFERENTIAL/PLATELET - Abnormal; Notable for the following components:   WBC 11.5 (*)    RBC 5.57 (*)    MCH 25.5 (*)    Neutro Abs 9.5 (*)    All other components within normal limits  LIPASE, BLOOD    EKG None  Radiology No results found.  Procedures Procedures (including critical care time)  Medications Ordered in ED Medications  fentaNYL (SUBLIMAZE) injection 50 mcg (50 mcg Intravenous Given 08/24/18 2016)  ondansetron (ZOFRAN) injection 4 mg (4 mg Intravenous Given 08/24/18 2017)     Initial Impression / Assessment and Plan / ED Course  I have  reviewed the triage vital signs and the nursing notes.  Pertinent labs & imaging results that were available during my care of the patient were reviewed by me and considered in my medical decision making (see chart for details).  Clinical Course as of Aug 24 945  Wed Aug 24, 6619  3250 63 year old female with left flank and left lower quadrant abdominal pain with some associated nausea vomiting and loose stool since yesterday.  Differential includes diverticulitis, lack,  Pilo.  Patient's urine shows 11-20 reds and no other signs of infection.  Put her in for some lab work and a CT KUB.  She says her pain is controlled right now.   [MB]  2251 Reading of the patient CT is mild left hydro-and perinephric stranding with a 2 to 3 mm stone layering dependently in the urinary bladder consistent with a recently passed stone.  Otherwise aortic atherosclerosis.  Preliminary reading from radiology.   [MB]  2252 Reviewed all this with the patient and she is comfortable with discharge.  She understands return if any worsening symptoms.  I also explained this is a pulmonary reading and there may be some change.   [MB]    Clinical Course User Index [MB] Hayden Rasmussen, MD        Final Clinical Impressions(s) / ED Diagnoses   Final diagnoses:  Left flank pain  Ureterolithiasis    ED Discharge Orders    None       Hayden Rasmussen, MD 08/25/18 [phone]

## 2018-08-24 NOTE — Discharge Instructions (Addendum)
You were seen in the emergency department for some left-sided flank pain.  Your urine showed some blood in and you had a CAT scan that showed some swelling in the left tube from the kidney to the bladder and signs of a passed kidney stone had happened.  This likely will not cause you any more troubles and you should feel improved over the course of the next day or so.  Please drink plenty of fluids.  Follow-up with your doctor and return if any worsening symptoms.

## 2018-08-24 NOTE — ED Triage Notes (Signed)
Pt c/o diarrhea on Monday, LLQ pain last night and has progressively became worse. Vomiting x1 on arrival here

## 2018-09-05 ENCOUNTER — Encounter: Payer: PRIVATE HEALTH INSURANCE | Admitting: Family Medicine

## 2018-11-02 ENCOUNTER — Other Ambulatory Visit: Payer: Self-pay | Admitting: Family Medicine

## 2018-11-02 DIAGNOSIS — I10 Essential (primary) hypertension: Secondary | ICD-10-CM

## 2018-11-03 NOTE — Telephone Encounter (Signed)
Patient need to schedule an ov for more refills. 

## 2018-11-04 ENCOUNTER — Other Ambulatory Visit: Payer: Self-pay | Admitting: Family Medicine

## 2018-11-04 DIAGNOSIS — I10 Essential (primary) hypertension: Secondary | ICD-10-CM

## 2018-11-04 NOTE — Telephone Encounter (Signed)
Last filled 07/05/17 Last OV 06/15/2017  Ok to fill?

## 2018-11-04 NOTE — Telephone Encounter (Signed)
Please see encounter below

## 2018-11-04 NOTE — Telephone Encounter (Signed)
Requested medication (s) are due for refill today: yes  Requested medication (s) are on the active medication list: yes  Last refill:  07/04/2017  Future visit scheduled: yes  Notes to clinic: Patient has appointment on 11/07/2018   Requested Prescriptions  Pending Prescriptions Disp Refills   atenolol-chlorthalidone (TENORETIC) 50-25 MG tablet 90 tablet 3    Sig: Take 1 tablet by mouth daily.     Cardiovascular: Beta Blocker + Diuretic Combos Failed - 11/04/2018  2:03 PM      Failed - Cr in normal range and within 180 days    Creatinine, Ser  Date Value Ref Range Status  08/24/2018 1.01 (H) 0.44 - 1.00 mg/dL Final         Failed - Valid encounter within last 6 months    Recent Outpatient Visits          1 year ago Exertional dyspnea   [facility] at [facility] [place], Malka So, MD   1 year ago Essential hypertension   [facility]rap[facility] at [facility] [place], Malka So, MD   2 years ago Generalized osteoarthritis of multiple sites   [employer] [facility] [place], Malka So, MD   2 years ago Dental abscess   Therapist, 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[facility], [place], NP   2 years ago Routine general medical examination at a health care facility   [employer] [facility] [place], Malka So, MD      Future Appointments            In 3 days [place], Malka So, MD [employer] [facility] [place], [place] in normal range and within 180 days    Potassium  Date Value Ref Range Status  08/24/2018 3.5 3.5 - 5.1 mmol/L Final         Passed - Na in normal range and within 180 days    Sodium  Date Value Ref Range Status  08/24/2018 139 135 - 145 mmol/L Final         Passed - Ca in normal range and within 180 days    Calcium  Date Value Ref Range Status  08/24/2018 9.8 8.9 - 10.3 mg/dL Final         Passed - Patient is not pregnant      Passed - Last BP in normal range    BP Readings from Last 1 Encounters:  08/24/18 128/72         Passed -  Last Heart Rate in normal range    Pulse Readings from Last 1 Encounters:  08/24/18 74

## 2018-11-04 NOTE — Telephone Encounter (Signed)
Copied from [place] [id number]. Topic: Quick Communication - Rx Refill/Question >> Nov 04, 2018  1:54 PM Rainey Pines A wrote: Medication: atenolol-chlorthalidone (TENORETIC) 50-25 MG tablet,simvastatin (ZOCOR) 20 MG tablet (Patient is completely out of medication and needs a small supply until her appt on 11/07/2018)  Has the patient contacted their pharmacy? Yes (Agent: If no, request that the patient contact the pharmacy for the refill.) (Agent: If yes, when and what did the pharmacy advise?)Contact PCP  Preferred Pharmacy (with phone number or street name): [id number] - [place], [place]. [phone] (Phone) [phone] (Fax)    Agent: Please be advised that RX refills may take up to 3 business days. We ask that you follow-up with your pharmacy.

## 2018-11-06 MED ORDER — ATENOLOL-CHLORTHALIDONE 50-25 MG PO TABS: 1.0000 | ORAL_TABLET | Freq: Every day | ORAL | 0 refills | Status: DC

## 2018-11-07 ENCOUNTER — Encounter: Payer: Self-pay | Admitting: Family Medicine

## 2018-11-07 ENCOUNTER — Other Ambulatory Visit: Payer: Self-pay

## 2018-11-07 ENCOUNTER — Other Ambulatory Visit: Payer: Self-pay | Admitting: Family Medicine

## 2018-11-07 ENCOUNTER — Ambulatory Visit (INDEPENDENT_AMBULATORY_CARE_PROVIDER_SITE_OTHER): Payer: No Typology Code available for payment source | Admitting: Family Medicine

## 2018-11-07 VITALS — BP 150/70 | HR 81 | Temp 97.6°F | Resp 12 | Ht 64.0 in | Wt 163.8 lb

## 2018-11-07 DIAGNOSIS — K635 Polyp of colon: Secondary | ICD-10-CM

## 2018-11-07 DIAGNOSIS — E782 Mixed hyperlipidemia: Secondary | ICD-10-CM | POA: Diagnosis not present

## 2018-11-07 DIAGNOSIS — Z20822 Contact with and (suspected) exposure to covid-19: Secondary | ICD-10-CM

## 2018-11-07 DIAGNOSIS — I10 Essential (primary) hypertension: Secondary | ICD-10-CM

## 2018-11-07 MED ORDER — ROSUVASTATIN CALCIUM 10 MG PO TABS: 10.0000 mg | ORAL_TABLET | Freq: Every day | ORAL | 1 refills | Status: DC

## 2018-11-07 MED ORDER — ATENOLOL-CHLORTHALIDONE 50-25 MG PO TABS: 1.0000 | ORAL_TABLET | Freq: Every day | ORAL | 2 refills | Status: DC

## 2018-11-07 NOTE — Assessment & Plan Note (Signed)
Problem is poorly controlled. We discussed possible complications of elevated BP. Educated about the importance of compliance with visits and with taking medication. Low-salt diet recommended. Resume atenolol-chlorthalidone 50-25 mg daily. Recommend monitoring BP regularly.

## 2018-11-07 NOTE — Assessment & Plan Note (Signed)
Severe. Low-fat diet recommended. We discussed benefits and side effects of the statin medications. She agrees with trying Crestor 10 mg daily. Follow-up in 3 to 4 months.

## 2018-11-07 NOTE — Progress Notes (Signed)
HPI:   [name] is a 63 y.o. female, who is here today for chronic disease management. She was last seen on 06/15/2017, when she was complaining about exertional dyspnea and heartburn, these problems have resolved. Hypertension: BP mildly elevated today. She is not checking BP at home regularly. She is on atenolol-chlorthalidone 50-25 mg daily.  She has not taken her medication 4-5 days.  Denies severe/frequent headache, visual changes, chest pain, dyspnea, palpitation, focal weakness, or edema.  Lab Results  Component Value Date   CREATININE 1.01 (H) 08/24/2018   BUN 22 08/24/2018   NA 139 08/24/2018   K 3.5 08/24/2018   CL 101 08/24/2018   CO2 27 08/24/2018   Hyperlipidemia: She not taking cholesterol medication. She has not been consistent with following low-fat diet.  Lab Results  Component Value Date   CHOL 282 (H) 05/19/2017   HDL 58.70 05/19/2017   LDLCALC 202 (H) 05/19/2017   LDLDIRECT 179.9 05/21/2009   TRIG 106.0 05/19/2017   CHOLHDL 5 05/19/2017   She is due for mammogram and colonoscopy.  Review of Systems  Constitutional: Negative for activity change, appetite change, fatigue and fever.  HENT: Negative for mouth sores, nosebleeds and sore throat.   Eyes: Negative for pain and redness.  Respiratory: Negative for cough and wheezing.   Gastrointestinal: Negative for abdominal pain, nausea and vomiting.       Negative for changes in bowel habits.  Genitourinary: Negative for decreased urine volume and hematuria.  Musculoskeletal: Positive for arthralgias. Negative for gait problem and myalgias.  Neurological: Negative for syncope and facial asymmetry.  Rest see pertinent positives and negatives per HPI.   No current outpatient medications on file prior to visit.   No current facility-administered medications on file prior to visit.     Past Medical History:  Diagnosis Date  . Arthritis   . GERD (gastroesophageal reflux disease)   .  Hypertension    No Known Allergies  Social History   Socioeconomic History  . Marital status: Divorced    Spouse name: Not on file  . Number of children: 1  . Years of education: Not on file  . Highest education level: Not on file  Occupational History  . Occupation: Therapist, nutritional  Social Needs  . Financial resource strain: Not on file  . Food insecurity    Worry: Not on file    Inability: Not on file  . Transportation needs    Medical: Not on file    Non-medical: Not on file  Tobacco Use  . Smoking status: Never Smoker  . Smokeless tobacco: Never Used  Substance and Sexual Activity  . Alcohol use: Yes    Alcohol/week: 1.0 - 2.0 standard drinks    Types: 1 - 2 Glasses of wine per week    Comment: 1-2 glasses every after noon  . Drug use: Yes    Types: Marijuana  . Sexual activity: Never    Comment: socially  Lifestyle  . Physical activity    Days per week: Not on file    Minutes per session: Not on file  . Stress: Not on file  Relationships  . Social Herbalist on phone: Not on file    Gets together: Not on file    Attends religious service: Not on file    Active member of club or organization: Not on file    Attends meetings of clubs or organizations: Not on file  Relationship status: Not on file  Other Topics Concern  . Not on file  Social History Narrative  . Not on file    Vitals:   11/07/18 1054  BP: (!) 150/70  Pulse: 81  Resp: 12  Temp: 97.6 F (36.4 C)  SpO2: 98%   Body mass index is 28.12 kg/m.  Physical Exam  Nursing note and vitals reviewed. Constitutional: She is oriented to person, place, and time. She appears well-developed. No distress.  HENT:  Head: Normocephalic and atraumatic.  Mouth/Throat: Oropharynx is clear and moist and mucous membranes are normal.  Eyes: Pupils are equal, round, and reactive to light. Conjunctivae are normal.  Cardiovascular: Normal rate and regular rhythm.  No murmur heard. Pulses:       Dorsalis pedis pulses are 2+ on the right side and 2+ on the left side.  Respiratory: Effort normal and breath sounds normal. No respiratory distress.  GI: Soft. She exhibits no mass. There is no hepatomegaly. There is no abdominal tenderness.  Musculoskeletal:        General: No edema.  Lymphadenopathy:    She has no cervical adenopathy.  Neurological: She is alert and oriented to person, place, and time. She has normal strength. No cranial nerve deficit. Gait normal.  Skin: Skin is warm. No rash noted. No erythema.  Psychiatric: She has a normal mood and affect.  Well groomed, good eye contact.   ASSESSMENT AND PLAN:  [name] was seen today for medication refill.  Diagnoses and all orders for this visit:  Essential hypertension Problem is poorly controlled. We discussed possible complications of elevated BP. Educated about the importance of compliance with visits and with taking medication. Low-salt diet recommended. Resume atenolol-chlorthalidone 50-25 mg daily. Recommend monitoring BP regularly.   Hyperlipidemia, mixed Severe. Low-fat diet recommended. We discussed benefits and side effects of the statin medications. She agrees with trying Crestor 10 mg daily. Follow-up in 3 to 4 months.  Polyp of colon, unspecified part of colon, unspecified type -     Ambulatory referral to Gastroenterology   She will schedule her mammogram. Refused vaccines: Influenza vaccine, Shingrix, and Pneumovax.  Return in about 4 months (around 03/07/2019) for cpe.   -[name] was advised to return sooner than planned today if new concerns arise.    Betty G. Martinique, MD  [facility]. [place] office.

## 2018-11-07 NOTE — Patient Instructions (Addendum)
A few things to remember from today's visit:   Hyperlipidemia, mixed - Plan: rosuvastatin (CRESTOR) 10 MG tablet  Essential hypertension - Plan: atenolol-chlorthalidone (TENORETIC) 50-25 MG tablet  Today we are starting Crestor 10 mg daily.  Please be sure medication list is accurate. If a new problem present, please set up appointment sooner than planned today.

## 2018-11-08 ENCOUNTER — Encounter: Payer: Self-pay | Admitting: Internal Medicine

## 2018-11-08 LAB — NOVEL CORONAVIRUS, NAA: SARS-CoV-2, NAA: NOT DETECTED

## 2018-11-08 NOTE — Telephone Encounter (Signed)
Please advise. This medication is not on the patients med list.  

## 2018-11-23 ENCOUNTER — Telehealth: Payer: Self-pay | Admitting: *Deleted

## 2018-11-23 NOTE — Telephone Encounter (Signed)
Pt. For got appointment rescheduled procedure for 12/21/18 and pre-vist 12/08/18

## 2018-12-06 ENCOUNTER — Telehealth: Payer: Self-pay | Admitting: Family Medicine

## 2018-12-06 MED ORDER — ATORVASTATIN CALCIUM 40 MG PO TABS: 40.0000 mg | ORAL_TABLET | Freq: Every day | ORAL | 3 refills | Status: DC

## 2018-12-06 NOTE — Telephone Encounter (Signed)
Rx for atorvastatin sent in. Patient is aware and will call back with any questions.

## 2018-12-06 NOTE — Telephone Encounter (Signed)
Patient states rosuvastatin (CRESTOR) 10 MG tablet cost $100 requesting alternate, please advise   [id number] - [place], [place] - [place].

## 2018-12-06 NOTE — Telephone Encounter (Signed)
She can try Atorvastatin 40 mg OR Lovastatin 40 mg daily. Thanks, BJ

## 2018-12-07 ENCOUNTER — Encounter: Payer: No Typology Code available for payment source | Admitting: Internal Medicine

## 2018-12-08 ENCOUNTER — Ambulatory Visit (AMBULATORY_SURGERY_CENTER): Payer: No Typology Code available for payment source | Admitting: *Deleted

## 2018-12-08 ENCOUNTER — Other Ambulatory Visit: Payer: Self-pay

## 2018-12-08 VITALS — Temp 96.5°F | Ht 64.0 in | Wt 162.6 lb

## 2018-12-08 DIAGNOSIS — Z86010 Personal history of colonic polyps: Secondary | ICD-10-CM

## 2018-12-08 DIAGNOSIS — Z1159 Encounter for screening for other viral diseases: Secondary | ICD-10-CM

## 2018-12-08 NOTE — Progress Notes (Signed)
Patient is here in-person for PV. Patient denies any allergies to eggs or soy. Patient denies any problems with anesthesia/sedation. Patient denies any oxygen use at home. Patient denies taking any diet/weight loss medications or blood thinners. Patient is not being treated for MRSA or C-diff. EMMI education assisgned to the patient for the procedure, this was explained and instructions given to patient. COVID-19 screening test is on 12/11, the pt is aware. Pt is aware that care partner will wait in the car during procedure; if they feel like they will be too hot or cold to wait in the car; they may wait in the 4 th floor lobby. Patient is aware to bring only one care partner. We want them to wear a mask (we do not have any that we can provide them), practice social distancing, and we will check their temperatures when they get here.  I did remind the patient that their care partner needs to stay in the parking lot the entire time and have a cell phone available, we will call them when the pt is ready for discharge. Patient will wear mask into building.

## 2018-12-21 ENCOUNTER — Encounter: Payer: No Typology Code available for payment source | Admitting: Internal Medicine

## 2019-03-07 ENCOUNTER — Ambulatory Visit (INDEPENDENT_AMBULATORY_CARE_PROVIDER_SITE_OTHER): Payer: Medicare HMO | Admitting: Family Medicine

## 2019-03-07 ENCOUNTER — Encounter: Payer: Self-pay | Admitting: Family Medicine

## 2019-03-07 ENCOUNTER — Other Ambulatory Visit: Payer: Self-pay

## 2019-03-07 VITALS — BP 110/70 | HR 76 | Temp 96.5°F | Resp 12 | Ht 64.0 in | Wt 151.4 lb

## 2019-03-07 DIAGNOSIS — R739 Hyperglycemia, unspecified: Secondary | ICD-10-CM | POA: Diagnosis not present

## 2019-03-07 DIAGNOSIS — Z0000 Encounter for general adult medical examination without abnormal findings: Secondary | ICD-10-CM

## 2019-03-07 DIAGNOSIS — E782 Mixed hyperlipidemia: Secondary | ICD-10-CM | POA: Diagnosis not present

## 2019-03-07 DIAGNOSIS — Z780 Asymptomatic menopausal state: Secondary | ICD-10-CM

## 2019-03-07 DIAGNOSIS — Z1159 Encounter for screening for other viral diseases: Secondary | ICD-10-CM

## 2019-03-07 DIAGNOSIS — Z9189 Other specified personal risk factors, not elsewhere classified: Secondary | ICD-10-CM

## 2019-03-07 LAB — BASIC METABOLIC PANEL
BUN: 17 mg/dL (ref 6–23)
CO2: 30 mEq/L (ref 19–32)
Calcium: 9.9 mg/dL (ref 8.4–10.5)
Chloride: 102 mEq/L (ref 96–112)
Creatinine, Ser: 0.76 mg/dL (ref 0.40–1.20)
GFR: 92.33 mL/min (ref >60.00)
Glucose, Bld: 114 mg/dL — ABNORMAL HIGH (ref 70–99)
Potassium: 3.8 mEq/L (ref 3.5–5.1)
Sodium: 141 mEq/L (ref 135–145)

## 2019-03-07 LAB — LIPID PANEL
Cholesterol: 305 mg/dL — ABNORMAL HIGH (ref 0–200)
HDL: 57.6 mg/dL (ref >39.00)
LDL Cholesterol: 213 mg/dL — ABNORMAL HIGH (ref 0–99)
NonHDL: 247.39
Total CHOL/HDL Ratio: 5
Triglycerides: 172 mg/dL — ABNORMAL HIGH (ref 0.0–149.0)
VLDL: 34.4 mg/dL (ref 0.0–40.0)

## 2019-03-07 LAB — HEMOGLOBIN A1C: Hgb A1c MFr Bld: 6.4 % (ref 4.6–6.5)

## 2019-03-07 NOTE — Progress Notes (Signed)
HPI:   Ms.Virgin Baes is a 64 y.o. female, who is here today for her routine physical.  Last CPE: 11/2015.  Regular exercise 3 or more time per week: Not consistently. Following a healthy diet: Trying to do better. She lives alone. Independent ADL's and IADL"s No alcohol intake. Sleeps 8-9 hours.  Chronic medical problems: HTN,HLD,and OA among some.  Pap smear: S/P hysterectomy due to fibroids. Hx of abnormal pap smears: Negative. She is not sexually active.  Immunization History  Administered Date(s) Administered  . Influenza,inj,Quad PF,6+ Mos 08/30/2013  . PPD Test 03/03/2017  . Td 01/05/1998, 05/21/2009   Mammogram: 06/2015, she got a reminder letter. Colonoscopy: 10/27/2013. Tubular adenoma, 5 years follow up recommended. DEXA: Never.  Hep C screening: Not done before.  She has no concerns today.  HLD: Her former insurance did not cover statin. Still eats some fried chicken 2 pieces per week.  Lab Results  Component Value Date   CHOL 282 (H) 05/19/2017   HDL 58.70 05/19/2017   LDLCALC 202 (H) 05/19/2017   LDLDIRECT 179.9 05/21/2009   TRIG 106.0 05/19/2017   CHOLHDL 5 05/19/2017   HTN: She is on Atenolol-Chlorthalidone 50-25 mg daily.  Component     Latest Ref Rng & Units 08/24/2018  Sodium     135 - 145 mEq/L 139  Potassium     3.5 - 5.1 mEq/L 3.5  Chloride     96 - 112 mEq/L 101  CO2     19 - 32 mEq/L 27  Glucose     70 - 99 mg/dL 123 (H)  BUN     6 - 23 mg/dL 22  Creatinine     0.40 - 1.20 mg/dL 1.01 (H)  Calcium     8.4 - 10.5 mg/dL 9.8    Review of Systems  Constitutional: Negative for appetite change, fatigue and fever.  HENT: Negative for dental problem, hearing loss, mouth sores, sore throat and trouble swallowing.   Eyes: Negative for redness and visual disturbance.  Respiratory: Negative for cough, shortness of breath and wheezing.   Cardiovascular: Negative for chest pain and leg swelling.  Gastrointestinal: Negative  for abdominal pain, nausea and vomiting.       No changes in bowel habits.  Endocrine: Negative for cold intolerance, heat intolerance, polydipsia, polyphagia and polyuria.  Genitourinary: Negative for decreased urine volume, dysuria, hematuria, vaginal bleeding and vaginal discharge.  Musculoskeletal: Negative for arthralgias, gait problem and myalgias.  Skin: Negative for color change and rash.  Allergic/Immunologic: Negative for environmental allergies.  Neurological: Negative for syncope, weakness and headaches.  Hematological: Negative for adenopathy. Does not bruise/bleed easily.  Psychiatric/Behavioral: Negative for confusion and sleep disturbance. The patient is not nervous/anxious.   All other systems reviewed and are negative.  Current Outpatient Medications on File Prior to Visit  Medication Sig Dispense Refill  . atenolol-chlorthalidone (TENORETIC) 50-25 MG tablet Take 1 tablet by mouth daily. 90 tablet 2  . MAGNESIUM PO Take by mouth.    Marland Kitchen VITAMIN D PO Take by mouth.     No current facility-administered medications on file prior to visit.     Past Medical History:  Diagnosis Date  . Arthritis   . GERD (gastroesophageal reflux disease)   . Hyperlipidemia   . Hypertension     Past Surgical History:  Procedure Laterality Date  . ABDOMINAL HYSTERECTOMY    . COLONOSCOPY  last 10/25/2013  . POLYPECTOMY    . ROTATOR CUFF REPAIR Right  No Known Allergies  Family History  Problem Relation Age of Onset  . Heart attack Father   . Ovarian cancer Mother   . Breast cancer Sister 71  . Diabetes Sister        x 3  . Colon cancer Neg Hx   . Esophageal cancer Neg Hx   . Rectal cancer Neg Hx   . Stomach cancer Neg Hx   . Colon polyps Neg Hx     Social History   Socioeconomic History  . Marital status: Divorced    Spouse name: Not on file  . Number of children: 1  . Years of education: Not on file  . Highest education level: Not on file  Occupational History    . Occupation: property mgmt  Tobacco Use  . Smoking status: Never Smoker  . Smokeless tobacco: Never Used  Substance and Sexual Activity  . Alcohol use: Yes    Alcohol/week: 3.0 standard drinks    Types: 3 Glasses of wine per week  . Drug use: Not Currently    Types: Marijuana  . Sexual activity: Never    Comment: socially  Other Topics Concern  . Not on file  Social History Narrative  . Not on file   Social Determinants of Health   Financial Resource Strain:   . Difficulty of Paying Living Expenses: Not on file  Food Insecurity:   . Worried About Charity fundraiser in the Last Year: Not on file  . Ran Out of Food in the Last Year: Not on file  Transportation Needs:   . Lack of Transportation (Medical): Not on file  . Lack of Transportation (Non-Medical): Not on file  Physical Activity:   . Days of Exercise per Week: Not on file  . Minutes of Exercise per Session: Not on file  Stress:   . Feeling of Stress : Not on file  Social Connections:   . Frequency of Communication with Friends and Family: Not on file  . Frequency of Social Gatherings with Friends and Family: Not on file  . Attends Religious Services: Not on file  . Active Member of Clubs or Organizations: Not on file  . Attends Archivist Meetings: Not on file  . Marital Status: Not on file    Vitals:   03/07/19 0909  BP: 110/70  Pulse: 76  Resp: 12  Temp: (!) 96.5 F (35.8 C)  SpO2: 98%   Body mass index is 25.98 kg/m.  Wt Readings from Last 3 Encounters:  03/07/19 151 lb 6 oz (68.7 kg)  12/08/18 162 lb 9.6 oz (73.8 kg)  11/07/18 163 lb 12.8 oz (74.3 kg)    Physical Exam  Nursing note and vitals reviewed. Constitutional: She is oriented to person, place, and time. She appears well-developed. No distress.  HENT:  Head: Normocephalic and atraumatic.  Right Ear: Hearing, tympanic membrane, external ear and ear canal normal.  Left Ear: Hearing, tympanic membrane, external ear and ear  canal normal.  Mouth/Throat: Uvula is midline, oropharynx is clear and moist and mucous membranes are normal.  Eyes: Pupils are equal, round, and reactive to light. Conjunctivae and EOM are normal.  Neck: No tracheal deviation present. No thyromegaly present.  Cardiovascular: Normal rate and regular rhythm.  No murmur heard. Pulses:      Dorsalis pedis pulses are 2+ on the right side and 2+ on the left side.  Respiratory: Effort normal and breath sounds normal. No respiratory distress.  GI: Soft. She exhibits  no mass. There is no hepatomegaly. There is no abdominal tenderness.  Genitourinary:    Genitourinary Comments: Breast: No masses,nipple discharge,or skin changes bilateral.   Musculoskeletal:        General: No edema.     Comments: No major deformity or signs of synovitis appreciated.  Lymphadenopathy:    She has no cervical adenopathy.       Right: No supraclavicular adenopathy present.       Left: No supraclavicular adenopathy present.  Neurological: She is alert and oriented to person, place, and time. She has normal strength. No cranial nerve deficit. Coordination and gait normal.  Reflex Scores:      Bicep reflexes are 2+ on the right side and 2+ on the left side.      Patellar reflexes are 2+ on the right side and 2+ on the left side. Skin: Skin is warm. No rash noted. No erythema.  Psychiatric: She has a normal mood and affect.  Well groomed, good eye contact.    ASSESSMENT AND PLAN:  Ms. Adisen Hutley was here today annual physical examination.  Orders Placed This Encounter  Procedures  . DG Bone Density  . Basic metabolic panel  . Hemoglobin A1c  . Lipid panel  . Hepatitis C antibody   Lab Results  Component Value Date   HGBA1C 6.4 03/07/2019   Lab Results  Component Value Date   CREATININE 0.76 03/07/2019   BUN 17 03/07/2019   NA 141 03/07/2019   K 3.8 03/07/2019   CL 102 03/07/2019   CO2 30 03/07/2019   Lab Results  Component Value Date   CHOL  305 (H) 03/07/2019   HDL 57.60 03/07/2019   LDLCALC 213 (H) 03/07/2019   LDLDIRECT 179.9 05/21/2009   TRIG 172.0 (H) 03/07/2019   CHOLHDL 5 03/07/2019    Routine general medical examination at a health care facility We discussed the importance of regular physical activity and healthy diet for prevention of chronic illness and/or complications. Preventive guidelines reviewed. Vaccination: Declined pneumovax. She will arrange mammogram.  Colonoscopy is overdue, she will call to arrange appt. Ca++ and vit D supplementation recommended. Next CPE in a year.  Hyperlipidemia, mixed Benefits of statin medications discussed. Recommendations will be given according to lab results.  Hyperglycemia Healthy life style for primary prevention.  Asymptomatic postmenopausal estrogen deficiency -     DG Bone Density; Future  Encounter for HCV screening test for high risk patient -     Hepatitis C antibody; Future    Return in 6 months (on 09/07/2019) for Medicare and f/u.   Twain Stenseth G. Martinique, MD  [facility]. [place] office.

## 2019-03-07 NOTE — Patient Instructions (Addendum)
A few things to remember from today's visit:   Routine general medical examination at a health care facility  Hyperlipidemia, mixed - Plan: Basic metabolic panel, Lipid panel  Hyperglycemia - Plan: Hemoglobin A1c  Asymptomatic postmenopausal estrogen deficiency - Plan: DG Bone Density  Today you have you routine preventive visit.  At least 150 minutes of moderate exercise per week, daily brisk walking for 15-30 min is a good exercise option. Healthy diet low in saturated (animal) fats and sweets and consisting of fresh fruits and vegetables, lean meats such as fish and white chicken and whole grains.  These are some of recommendations for screening depending of age and risk factors:  - Vaccines:  Tdap vaccine every 10 years.  Shingles vaccine recommended at age 45, could be given after 66 years of age but not sure about insurance coverage.   Pneumonia vaccines: Pneumovax at 37. Sometimes Pneumovax is giving earlier if history of smoking, lung disease,diabetes,kidney disease among some.  Screening for diabetes at age 35 and every 3 years.  Cervical cancer prevention:  S/P hysterectomy.  -Breast cancer: Mammogram: There is disagreement between experts about when to start screening in low risk asymptomatic female but recent recommendations are to start screening at 50 and not later than 66 years old , every 1-2 years and after 66 yo q 2 years. Screening is recommended until 66 years old but some women can continue screening depending of healthy issues.  Colon cancer screening: starts at 66 years old until 66 years old.  Cholesterol disorder screening at age 48 and every 3 years.  Also recommended:  1. Dental visit- Brush and floss your teeth twice daily; visit your dentist twice a year. 2. Eye doctor- Get an eye exam at least every 2 years. 3. Helmet use- Always wear a helmet when riding a bicycle, motorcycle, rollerblading or skateboarding. 4. Safe sex- If you may be exposed  to sexually transmitted infections, use a condom. 5. Seat belts- Seat belts can save your live; always wear one. 6. Smoke/Carbon Monoxide detectors- These detectors need to be installed on the appropriate level of your home. Replace batteries at least once a year. 7. Skin cancer- When out in the sun please cover up and use sunscreen 15 SPF or higher. 8. Violence- If anyone is threatening or hurting you, please tell your healthcare provider.  9. Drink alcohol in moderation- Limit alcohol intake to one drink or less per day. Never drink and drive.  Please be sure medication list is accurate. If a new problem present, please set up appointment sooner than planned today.

## 2019-03-10 ENCOUNTER — Other Ambulatory Visit: Payer: Self-pay

## 2019-03-10 MED ORDER — ATORVASTATIN CALCIUM 40 MG PO TABS: 40.0000 mg | ORAL_TABLET | Freq: Every day | ORAL | 3 refills | Status: DC

## 2019-06-13 ENCOUNTER — Telehealth: Payer: Self-pay | Admitting: Family Medicine

## 2019-06-13 ENCOUNTER — Encounter: Payer: Self-pay | Admitting: Family Medicine

## 2019-06-13 ENCOUNTER — Telehealth (INDEPENDENT_AMBULATORY_CARE_PROVIDER_SITE_OTHER): Payer: Medicare HMO | Admitting: Family Medicine

## 2019-06-13 DIAGNOSIS — S61239A Puncture wound without foreign body of unspecified finger without damage to nail, initial encounter: Secondary | ICD-10-CM

## 2019-06-13 DIAGNOSIS — Z7721 Contact with and (suspected) exposure to potentially hazardous body fluids: Secondary | ICD-10-CM

## 2019-06-13 DIAGNOSIS — W460XXA Contact with hypodermic needle, initial encounter: Secondary | ICD-10-CM | POA: Diagnosis not present

## 2019-06-13 NOTE — Progress Notes (Addendum)
Virtual Visit via Telephone Note  I connected with Alexis Stout on 06/20/19 at  3:20 PM EDT by telephone and verified that I am speaking with the correct person using two identifiers.   I discussed the limitations, risks, security and privacy concerns of performing an evaluation and management service by telephone and the availability of in person appointments. I also discussed with the patient that there may be a patient responsible charge related to this service. The patient expressed understanding and agreed to proceed.  Location patient: home Location provider: work or home office Participants present for the call: patient, provider Patient did not have a visit in the prior 7 days to address this/these issue(s).   History of Present Illness:  Alexis Stout was cleaning out a house today and was stuck by a needle (attached to a syringe) on her finger. 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 She is not sure if she has had the hep b vaccines. She is not sure if the wound bled - did not see blood, just a small mark and pain in the area. She washed the area well.    Observations/Objective: Patient sounds cheerful and well on the phone. I do not appreciate any SOB. Speech and thought processing are grossly intact. Patient reported vitals:  Assessment and Plan:  Accident caused by hypodermic needle, initial encounter  Exposure to body fluid  Puncture wound of finger, initial encounter  -we discussed possible serious and likely etiologies, options for evaluation and workup, limitations of telemedicine visit vs in person visit, treatment, treatment risks and precautions. Pt prefers to treat via telemedicine empirically rather then risking or undertaking an in person visit at this moment. Discussed risks, need for labs and possible medication options. Contacted infectious disease and spoke  with Dr. Baxter Flattery whom graciously offered to work the patient in for a visit to address this tomorrow. I let patient know and advised her to contact her PCP office if had not been contacted about the consult appointment by tomorrow morning.  Follow Up Instructions:   I did not refer this patient for an OV in the next 24 hours for this/these issue(s).  I discussed the assessment and treatment plan with the patient. The patient was provided an opportunity to ask questions and all were answered. The patient agreed with the plan and demonstrated an understanding of the instructions.   The patient was advised to call back or seek an in-person evaluation if the symptoms worsen or if the condition fails to improve as anticipated.  I provided 13 minutes of non-face-to-face time during this encounter.   Lucretia Kern, DO

## 2019-06-13 NOTE — Telephone Encounter (Signed)
Tried to contact patient to cancel appointment and suggest going to an urgent care because this is an injury that needs to be seen in person.

## 2019-06-14 ENCOUNTER — Other Ambulatory Visit: Payer: Self-pay

## 2019-06-14 ENCOUNTER — Ambulatory Visit (INDEPENDENT_AMBULATORY_CARE_PROVIDER_SITE_OTHER): Payer: Medicare HMO | Admitting: Infectious Diseases

## 2019-06-14 VITALS — BP 145/77 | HR 73 | Ht 64.0 in | Wt 158.0 lb

## 2019-06-14 DIAGNOSIS — W460XXA Contact with hypodermic needle, initial encounter: Secondary | ICD-10-CM | POA: Diagnosis not present

## 2019-06-14 DIAGNOSIS — S61249A Puncture wound with foreign body of unspecified finger without damage to nail, initial encounter: Secondary | ICD-10-CM | POA: Diagnosis not present

## 2019-06-14 MED ORDER — EMTRICITABINE-TENOFOVIR AF 200-25 MG PO TABS: 1.0000 | ORAL_TABLET | Freq: Every day | ORAL | 11 refills | Status: DC

## 2019-06-14 MED ORDER — DOLUTEGRAVIR SODIUM 50 MG PO TABS: 50.0000 mg | ORAL_TABLET | Freq: Every day | ORAL | 11 refills | Status: DC

## 2019-06-14 NOTE — Patient Instructions (Addendum)
Please stop by the lab on your way out.   Would like to see you back in a month when you are done with your treatment   Please take every pill once a day for 28 days.

## 2019-06-15 ENCOUNTER — Telehealth: Payer: Self-pay | Admitting: Pharmacy Technician

## 2019-06-15 LAB — COMPREHENSIVE METABOLIC PANEL
AG Ratio: 1.7 (calc) (ref 1.0–2.5)
ALT: 13 U/L (ref 6–29)
AST: 12 U/L (ref 10–35)
Albumin: 4.4 g/dL (ref 3.6–5.1)
Alkaline phosphatase (APISO): 107 U/L (ref 37–153)
BUN: 17 mg/dL (ref 7–25)
CO2: 27 mmol/L (ref 20–32)
Calcium: 9.6 mg/dL (ref 8.6–10.4)
Chloride: 106 mmol/L (ref 98–110)
Creat: 0.83 mg/dL (ref 0.50–0.99)
Globulin: 2.6 g/dL (calc) (ref 1.9–3.7)
Glucose, Bld: 114 mg/dL — ABNORMAL HIGH (ref 65–99)
Potassium: 4 mmol/L (ref 3.5–5.3)
Sodium: 140 mmol/L (ref 135–146)
Total Bilirubin: 0.3 mg/dL (ref 0.2–1.2)
Total Protein: 7 g/dL (ref 6.1–8.1)

## 2019-06-15 LAB — HEPATITIS B SURFACE ANTIGEN: Hepatitis B Surface Ag: NONREACTIVE

## 2019-06-15 LAB — HEPATITIS B SURFACE ANTIBODY,QUALITATIVE: Hep B S Ab: NONREACTIVE

## 2019-06-15 LAB — HEPATITIS C ANTIBODY
Hepatitis C Ab: NONREACTIVE
SIGNAL TO CUT-OFF: 0.01 (ref <1.00)

## 2019-06-15 NOTE — Telephone Encounter (Signed)
[facility] Patient Advocate Encounter   I was successful in securing patient a $7500 grant from [employer] (PA[employer]) to provide copayment coverage for Tivicay and Descovy. This will make the out of pocket cost $0 and cover her $600 copays.     I have spoken with the patient and she will pick up today at [facility].    The billing information is   Dates of Eligibility: 06/15/2019 through 06/14/2020  Patient knows to call the office with questions or concerns.  Bartholomew Crews, CPhT [facility] Phone: [phone] Fax: [phone] 06/15/2019 8:48 AM

## 2019-06-15 NOTE — Telephone Encounter (Signed)
Thanks Adonis Brook!

## 2019-06-16 ENCOUNTER — Telehealth: Payer: Self-pay | Admitting: *Deleted

## 2019-06-16 NOTE — Telephone Encounter (Signed)
Attempted to reach patient to relay message. Call went to full voicemail, unable to leave message.  Will attempt Monday. Landis Gandy, RN

## 2019-06-16 NOTE — Telephone Encounter (Signed)
-----   Message from [place] Callas, NP sent at 06/16/2019  3:47 PM EDT ----- Sharyn Lull can you call [facility] to let her know all the bloodwork we did came back normal, which at this time I would expect so soon after the needle stick.  No concerns with her taking the tivicay and discovy. Hopefully she has been able to receive the medications and started taking them both once a day.  Will need to repeat some of this at her return visit in a month.   Thanks!

## 2019-06-19 DIAGNOSIS — W460XXA Contact with hypodermic needle, initial encounter: Secondary | ICD-10-CM | POA: Insufficient documentation

## 2019-06-19 NOTE — Telephone Encounter (Signed)
Called patient this morning.  She did not pick up the medication as directed last week, stated she had a busy weekend and will pick it up today.  RN explained, per Colletta Maryland, that it is too late to start the post exposure treatment now as she is past the 72 hour treatment window.  Per Colletta Maryland, the greatest risk from her exposure is from Hepatitis, and PEP would give no protection for these.  The only course for hepatitis after exposure would be tracking her labs at 1 and 3 months, with treatment/cure should she develop the disease. RN scheduled patient for appointments. She had no additional questions at this time, but will call back if she has any.  RN sent her a MyChart activation link as well. Landis Gandy, RN

## 2019-06-19 NOTE — Assessment & Plan Note (Signed)
Needle stick occurring 06/12/2019 afternoon with uncapped needle. It is unknown as to who the needle was used by prior to the puncture and this information will not likely be available, so no source to test. There is a possibility the needle was used to inject non-medical substances based on Ms. Blevens's assessment.  Overall I am less concerned about HIV transmission (~0.3% risk) but given that it is unclear when the needle was used we discussed post-exposure prophylaxis with Tivicay and Descovy x 28 days. Will have pharmacy team help secure assistance to afford her medication. She will need to initiate this within 72 hours following puncture for benefit.  Hepatitis B immunity to be assessed.  Hepatitis C infection course discussed - no preventative medications available just watch and wait. This is the higher risk pathogen to be on the look out here.  Baseline labs today including CMET, Hepatitis serologies, HIV Ab.  Will have her back for HIV, Hep B and Hep C viral loads in 1 month.

## 2019-06-19 NOTE — Progress Notes (Signed)
Patient: Alexis Stout  DOB: 1955-10-18 MRN: [id number] PCP: Martinique, Betty G, MD     Patient Active Problem List   Diagnosis Date Noted  . Accident caused by a hypodermic needle 06/19/2019  . Toe problem 07/27/2017  . Hallux valgus (acquired), right foot 07/27/2017  . Hallux valgus (acquired), left foot 07/27/2017  . Heart palpitations 06/15/2017  . Generalized osteoarthritis of multiple sites 03/29/2016  . Abdominal pain, chronic, right lower quadrant 08/30/2013  . Hyperlipidemia, mixed 05/21/2009  . Essential hypertension 09/09/2006     Subjective:   Chief Complaint  Patient presents with  . New Patient (Initial Visit)    Patient reports she was stuck by a needle while cleaning someones home     HPI:  Alexis Stout was recently inside one of her tenant's homes cleaning up and was punctured on her finger by something sharp. Upon further investigation she noticed a needle that was un-capped. She does not think she drew any blood following the needle puncture. Immediately cleaned her finger under running soap and water. She contacted her PCP for further recommendations who d/w Dr. Baxter Flattery and recommended post-exposure prophylaxis for HIV and further testing for hepatitis.   Alexis Stout is a [employer]in [employer] and self-employed. She has regular health visits with her PCP Dr. Martinique. She was vaccinated as a child from what she remembers.  Does not recall every being specifically tested / screened for hepatitis or HIV in the past.  Day of needle stick was 06/12/2019.    Review of Systems  Constitutional: Negative for chills, fever, malaise/fatigue and weight loss.  HENT: Negative for sore throat.   Respiratory: Negative for cough, sputum production and shortness of breath.   Cardiovascular: Negative.   Gastrointestinal: Negative for abdominal pain, diarrhea and vomiting.  Musculoskeletal: Negative for joint pain, myalgias and neck pain.  Skin: Negative for rash.  Neurological: Negative for  headaches.  Psychiatric/Behavioral: Negative for depression and substance abuse. The patient is not nervous/anxious.     Past Medical History:  Diagnosis Date  . Arthritis   . GERD (gastroesophageal reflux disease)   . Hyperlipidemia   . Hypertension     Outpatient Medications Prior to Visit  Medication Sig Dispense Refill  . atenolol-chlorthalidone (TENORETIC) 50-25 MG tablet Take 1 tablet by mouth daily. 90 tablet 2  . atorvastatin (LIPITOR) 40 MG tablet Take 1 tablet (40 mg total) by mouth daily. 30 tablet 3  . MAGNESIUM PO Take by mouth.    Marland Kitchen VITAMIN D PO Take by mouth.     No facility-administered medications prior to visit.     No Known Allergies  Social History   Tobacco Use  . Smoking status: Never Smoker  . Smokeless tobacco: Never Used  Vaping Use  . Vaping Use: Never used  Substance Use Topics  . Alcohol use: Yes    Alcohol/week: 3.0 standard drinks    Types: 3 Glasses of wine per week  . Drug use: Not Currently    Types: Marijuana    Family History  Problem Relation Age of Onset  . Heart attack Father   . Ovarian cancer Mother   . Breast cancer Sister 92  . Diabetes Sister        x 3  . Colon cancer Neg Hx   . Esophageal cancer Neg Hx   . Rectal cancer Neg Hx   . Stomach cancer Neg Hx   . Colon polyps Neg Hx     Objective:   Vitals:  06/14/19 1554  BP: (!) 145/77  Pulse: 73  Weight: 158 lb (71.7 kg)  Height: 5\' 4"  (1.626 m)   Body mass index is 27.12 kg/m.  Physical Exam Constitutional:      Appearance: Normal appearance. She is not ill-appearing.  HENT:     Mouth/Throat:     Mouth: Mucous membranes are moist.     Pharynx: Oropharynx is clear.  Eyes:     General: No scleral icterus. Pulmonary:     Effort: Pulmonary effort is normal.  Skin:    Comments: Small superficial appearing puncture site at tip of pointer finger.   Neurological:     Mental Status: She is oriented to person, place, and time.  Psychiatric:        Mood  and Affect: Mood normal.        Thought Content: Thought content normal.     Lab Results: Lab Results  Component Value Date   WBC 11.5 (H) 08/24/2018   HGB 14.2 08/24/2018   HCT 44.9 08/24/2018   MCV 80.6 08/24/2018   PLT 243 08/24/2018    Lab Results  Component Value Date   CREATININE 0.83 06/14/2019   BUN 17 06/14/2019   NA 140 06/14/2019   K 4.0 06/14/2019   CL 106 06/14/2019   CO2 27 06/14/2019    Lab Results  Component Value Date   ALT 13 06/14/2019   AST 12 06/14/2019   ALKPHOS 83 08/24/2018   BILITOT 0.3 06/14/2019     Assessment & Plan:   Problem List Items Addressed This Visit      Unprioritized   Accident caused by a hypodermic needle - Primary    Needle stick occurring 06/12/2019 afternoon with uncapped needle. It is unknown as to who the needle was used by prior to the puncture and this information will not likely be available, so no source to test. There is a possibility the needle was used to inject non-medical substances based on Ms. Rampersad's assessment.  Overall I am less concerned about HIV transmission (~0.3% risk) but given that it is unclear when the needle was used we discussed post-exposure prophylaxis with Tivicay and Descovy x 28 days. Will have pharmacy team help secure assistance to afford her medication. She will need to initiate this within 72 hours following puncture for benefit.  Hepatitis B immunity to be assessed.  Hepatitis C infection course discussed - no preventative medications available just watch and wait. This is the higher risk pathogen to be on the look out here.  Baseline labs today including CMET, Hepatitis serologies, HIV Ab.  Will have her back for HIV, Hep B and Hep C viral loads in 1 month.      Relevant Orders   CBC   Comprehensive metabolic panel (Completed)   Hepatitis B surface antibody,qualitative (Completed)   Hepatitis B surface antigen (Completed)   Hepatitis C antibody (Completed)   HIV Antibody (routine testing  w rflx)      Janene Madeira, MSN, NP-C [facility] [place] Pager: [phone] Office: [phone]  06/19/19  3:36 PM

## 2019-07-06 ENCOUNTER — Other Ambulatory Visit: Payer: Self-pay | Admitting: Infectious Diseases

## 2019-07-06 DIAGNOSIS — W460XXA Contact with hypodermic needle, initial encounter: Secondary | ICD-10-CM

## 2019-07-11 ENCOUNTER — Other Ambulatory Visit: Payer: Medicare HMO

## 2019-07-13 ENCOUNTER — Telehealth: Payer: Self-pay

## 2019-07-13 NOTE — Telephone Encounter (Signed)
Patient called office today with question about upcoming appointment and request lab results. Patient would like to know why she is being seen so soon. Informed her appointment was base on last conversation she had with RN. Relayed lab results to patient.  Patient requesting she reschedule appt for 7/9 to a later day. Front desk will reschedule appointment Aundria Rud, [place]

## 2019-07-14 ENCOUNTER — Ambulatory Visit: Payer: Medicare HMO | Admitting: Infectious Diseases

## 2019-08-04 ENCOUNTER — Ambulatory Visit: Payer: Medicare HMO | Admitting: Infectious Diseases

## 2019-10-13 ENCOUNTER — Ambulatory Visit (INDEPENDENT_AMBULATORY_CARE_PROVIDER_SITE_OTHER): Payer: Medicare HMO | Admitting: Internal Medicine

## 2019-10-13 ENCOUNTER — Encounter: Payer: Self-pay | Admitting: Internal Medicine

## 2019-10-13 ENCOUNTER — Other Ambulatory Visit: Payer: Self-pay

## 2019-10-13 ENCOUNTER — Ambulatory Visit (INDEPENDENT_AMBULATORY_CARE_PROVIDER_SITE_OTHER): Payer: Medicare HMO

## 2019-10-13 VITALS — BP 152/82 | HR 54 | Temp 98.8°F | Ht 64.0 in | Wt 151.6 lb

## 2019-10-13 DIAGNOSIS — M8700 Idiopathic aseptic necrosis of unspecified bone: Secondary | ICD-10-CM | POA: Diagnosis not present

## 2019-10-13 DIAGNOSIS — M25431 Effusion, right wrist: Secondary | ICD-10-CM

## 2019-10-13 DIAGNOSIS — M25531 Pain in right wrist: Secondary | ICD-10-CM

## 2019-10-13 MED ORDER — NAPROXEN SODIUM 550 MG PO TABS: 550.0000 mg | ORAL_TABLET | Freq: Two times a day (BID) | ORAL | 1 refills | Status: DC

## 2019-10-13 NOTE — Progress Notes (Signed)
Chief Complaint  Patient presents with  . Hand Pain    HPI: Alexis Stout 64 y.o. come in for [facility] PCP appt na.\Onset about 1-1/2 to 2 weeks ago of swelling dorsum right hand wrist area without associated trauma or insider.  Symptoms of wax and wanes over the last couple nights has been very painful and made it hard to sleep no matter what position so she took a leftover pain pill to help sleep. No other treatment.  No history of same she is right-handed uses her hands and renovation but no other repetitive motion or excessive keyboarding.  No treatment otherwise heat or cold. No known family history of inflammatory arthritis. ROS: See pertinent positives and negatives per HPI.  Past Medical History:  Diagnosis Date  . Arthritis   . GERD (gastroesophageal reflux disease)   . Hyperlipidemia   . Hypertension     Family History  Problem Relation Age of Onset  . Heart attack Father   . Ovarian cancer Mother   . Breast cancer Sister 50  . Diabetes Sister        x 3  . Colon cancer Neg Hx   . Esophageal cancer Neg Hx   . Rectal cancer Neg Hx   . Stomach cancer Neg Hx   . Colon polyps Neg Hx     Social History   Socioeconomic History  . Marital status: Divorced    Spouse name: Not on file  . Number of children: 1  . Years of education: Not on file  . Highest education level: Not on file  Occupational History  . Occupation: property mgmt  Tobacco Use  . Smoking status: Never Smoker  . Smokeless tobacco: Never Used  Vaping Use  . Vaping Use: Never used  Substance and Sexual Activity  . Alcohol use: Yes    Alcohol/week: 3.0 standard drinks    Types: 3 Glasses of wine per week  . Drug use: Not Currently    Types: Marijuana  . Sexual activity: Never    Comment: socially  Other Topics Concern  . Not on file  Social History Narrative  . Not on file   Social Determinants of Health   Financial Resource Strain:   . Difficulty of Paying Living Expenses: Not on  file  Food Insecurity:   . Worried About Charity fundraiser in the Last Year: Not on file  . Ran Out of Food in the Last Year: Not on file  Transportation Needs:   . Lack of Transportation (Medical): Not on file  . Lack of Transportation (Non-Medical): Not on file  Physical Activity:   . Days of Exercise per Week: Not on file  . Minutes of Exercise per Session: Not on file  Stress:   . Feeling of Stress : Not on file  Social Connections:   . Frequency of Communication with Friends and Family: Not on file  . Frequency of Social Gatherings with Friends and Family: Not on file  . Attends Religious Services: Not on file  . Active Member of Clubs or Organizations: Not on file  . Attends Archivist Meetings: Not on file  . Marital Status: Not on file    Outpatient Medications Prior to Visit  Medication Sig Dispense Refill  . atenolol-chlorthalidone (TENORETIC) 50-25 MG tablet Take 1 tablet by mouth daily. 90 tablet 2  . atorvastatin (LIPITOR) 40 MG tablet Take 1 tablet (40 mg total) by mouth daily. (Patient not taking: Reported on 10/13/2019) 30  tablet 3  . dolutegravir (TIVICAY) 50 MG tablet Take 1 tablet (50 mg total) by mouth daily. (Patient not taking: Reported on 10/13/2019) 30 tablet 11  . emtricitabine-tenofovir AF (DESCOVY) 200-25 MG tablet Take 1 tablet by mouth daily. (Patient not taking: Reported on 10/13/2019) 30 tablet 11  . MAGNESIUM PO Take by mouth. (Patient not taking: Reported on 10/13/2019)    . VITAMIN D PO Take by mouth. (Patient not taking: Reported on 10/13/2019)     No facility-administered medications prior to visit.     EXAM:  BP (!) 152/82 (BP Location: Left Arm, Patient Position: Sitting, Cuff Size: Normal)   Pulse (!) 54   Temp 98.8 F (37.1 C) (Oral)   Ht 5\' 4"  (1.626 m)   Wt 151 lb 9.6 oz (68.8 kg)   LMP  (LMP Unknown)   SpO2 98%   BMI 26.02 kg/m   Body mass index is 26.02 kg/m.  GENERAL: vitals reviewed and listed above, alert,  oriented, appears well hydrated and in no acute distress HEENT: atraumatic, conjunctiva  clear, no obvious abnormalities on inspection of external nose and ears OP : masked NECK: no obvious masses on inspection palpation  CV: HRRR, no clubbing cyanosis or  peripheral edema nl cap refill  MS: moves all extremities w right hand with somewhat indiscrete swelling at the dorsum hand wrist.  Appears to move with tendon extension flexion.  Pain with wrist flexion and less with extension.  No redness or warmth but is quite tender with movement.  Slight ulnar deviation at MTP. PSYCH: pleasant and cooperative, no obvious depression or anxiety Lab Results  Component Value Date   WBC 11.5 (H) 08/24/2018   HGB 14.2 08/24/2018   HCT 44.9 08/24/2018   PLT 243 08/24/2018   GLUCOSE 114 (H) 06/14/2019   CHOL 305 (H) 03/07/2019   TRIG 172.0 (H) 03/07/2019   HDL 57.60 03/07/2019   LDLDIRECT 179.9 05/21/2009   LDLCALC 213 (H) 03/07/2019   ALT 13 06/14/2019   AST 12 06/14/2019   NA 140 06/14/2019   K 4.0 06/14/2019   CL 106 06/14/2019   CREATININE 0.83 06/14/2019   BUN 17 06/14/2019   CO2 27 06/14/2019   TSH 0.95 11/11/2015   HGBA1C 6.4 03/07/2019   BP Readings from Last 3 Encounters:  10/13/19 (!) 152/82  06/14/19 (!) 145/77  03/07/19 110/70    ASSESSMENT AND PLAN:  Discussed the following assessment and plan:  Pain and swelling of right wrist - Plan: DG Hand Complete Right, DG Wrist Complete Right Hand wrist without trauma x-ray today anti-inflammatory trial and consider  follow-up sports medicine.  Cannot make a case for overuse and is not classic for ganglion but location is typical. Encouraged and discussed follow up  sign up with my chart. -Patient advised to return or notify health care team  if  new concerns arise.  Patient Instructions  Get x ray and begin antiinflammatory and we can  Do referral to sports medicine or   Other  As follow up.   Relative rest this weekend.  Of the wrist  hand .   You will be notified of the x ray results on my chart.    Standley Brooking. Isela Stantz M.D.  Rhunette Croft ray shows   Poss avascular necrosis    Plan referral to hand surgery.  IMPRESSION: 1. Mild degenerative changes at the first and fifth CMC joints. 2. Borderline widening of the scapholunate interval, possible ligamentous injury. 3. Patchy sclerosis in the lunate  bone, suggestive of avascular necrosis.

## 2019-10-13 NOTE — Progress Notes (Signed)
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    I placed referral    in system.

## 2019-10-13 NOTE — Patient Instructions (Signed)
Get x ray and begin antiinflammatory and we can  Do referral to sports medicine or   Other  As follow up.   Relative rest this weekend.  Of the wrist hand .   You will be notified of the x ray results on my chart.

## 2019-10-26 ENCOUNTER — Telehealth: Payer: Self-pay | Admitting: Family Medicine

## 2019-10-26 NOTE — Telephone Encounter (Signed)
Pt is calling in stating that she needs a Tb test done is she eligible for one?  Pt would like to have a call back to get scheduled.

## 2019-10-27 NOTE — Telephone Encounter (Signed)
Okay to schedule

## 2019-10-31 NOTE — Telephone Encounter (Signed)
This is not a test that is done regularly as part of healthcare maintenance. Some type of jobs/school programs require it. Another indication is possible exposure. Thre is a questionnaire that can help determine if TB test is needed. Thanks, BJ

## 2019-10-31 NOTE — Telephone Encounter (Signed)
I left pt a voicemail letting her know that we need to know why she needs the TB test done before it can be ordered.

## 2019-11-07 NOTE — Telephone Encounter (Signed)
Spoke with pt, she already had the TB test done.  Pt is allowed to have TB test done in office if it is needed for work for future reference.

## 2019-11-10 ENCOUNTER — Ambulatory Visit (INDEPENDENT_AMBULATORY_CARE_PROVIDER_SITE_OTHER): Payer: Medicare HMO

## 2019-11-10 ENCOUNTER — Ambulatory Visit
Admission: EM | Admit: 2019-11-10 | Discharge: 2019-11-10 | Disposition: A | Discharge status: Home / Self Care | Payer: Medicare HMO | Attending: Family Medicine | Admitting: Family Medicine

## 2019-11-10 ENCOUNTER — Other Ambulatory Visit: Payer: Self-pay

## 2019-11-10 DIAGNOSIS — S4992XA Unspecified injury of left shoulder and upper arm, initial encounter: Secondary | ICD-10-CM | POA: Diagnosis not present

## 2019-11-10 DIAGNOSIS — S8992XA Unspecified injury of left lower leg, initial encounter: Secondary | ICD-10-CM | POA: Diagnosis not present

## 2019-11-10 DIAGNOSIS — M79605 Pain in left leg: Secondary | ICD-10-CM

## 2019-11-10 DIAGNOSIS — S300XXA Contusion of lower back and pelvis, initial encounter: Secondary | ICD-10-CM

## 2019-11-10 DIAGNOSIS — M25512 Pain in left shoulder: Secondary | ICD-10-CM

## 2019-11-10 DIAGNOSIS — M545 Low back pain, unspecified: Secondary | ICD-10-CM

## 2019-11-10 DIAGNOSIS — S3992XA Unspecified injury of lower back, initial encounter: Secondary | ICD-10-CM

## 2019-11-10 DIAGNOSIS — M25572 Pain in left ankle and joints of left foot: Secondary | ICD-10-CM | POA: Diagnosis not present

## 2019-11-10 DIAGNOSIS — S99912A Unspecified injury of left ankle, initial encounter: Secondary | ICD-10-CM

## 2019-11-10 DIAGNOSIS — M7989 Other specified soft tissue disorders: Secondary | ICD-10-CM | POA: Diagnosis not present

## 2019-11-10 DIAGNOSIS — M25562 Pain in left knee: Secondary | ICD-10-CM | POA: Diagnosis not present

## 2019-11-10 MED ORDER — MELOXICAM 7.5 MG PO TABS: 7.5000 mg | ORAL_TABLET | Freq: Every day | ORAL | 1 refills | Status: DC

## 2019-11-10 MED ORDER — TIZANIDINE HCL 4 MG PO TABS: 4.0000 mg | ORAL_TABLET | Freq: Four times a day (QID) | ORAL | 0 refills | Status: DC | PRN

## 2019-11-10 MED ORDER — SILVER SULFADIAZINE 1 % EX CREA: 1.0000 "application " | TOPICAL_CREAM | Freq: Two times a day (BID) | CUTANEOUS | 0 refills | Status: DC

## 2019-11-10 NOTE — Discharge Instructions (Addendum)
You may alternate tylenol and continue naproxen once daily  as needed for pain. I have also prescribed a muscle relaxer to take as needed for pain.  I prescribed her Silvadene to apply on the abrasive wound on your buttocks. You should cover with a bandage as medication can stain. Elevate left leg and ice knee and ankle for 20 minutes at least 3 times per day. I am putting information on your discharge paperwork to follow-up with Dr. Raeford Razor he is sportsman's specialist.
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
## 2019-12-19 ENCOUNTER — Ambulatory Visit: Payer: Medicare HMO | Admitting: Family Medicine

## 2019-12-20 ENCOUNTER — Ambulatory Visit (INDEPENDENT_AMBULATORY_CARE_PROVIDER_SITE_OTHER): Payer: Medicare HMO

## 2019-12-20 DIAGNOSIS — Z1211 Encounter for screening for malignant neoplasm of colon: Secondary | ICD-10-CM | POA: Diagnosis not present

## 2019-12-20 DIAGNOSIS — Z1231 Encounter for screening mammogram for malignant neoplasm of breast: Secondary | ICD-10-CM

## 2019-12-20 DIAGNOSIS — Z0000 Encounter for general adult medical examination without abnormal findings: Secondary | ICD-10-CM | POA: Diagnosis not present

## 2019-12-20 NOTE — Patient Instructions (Signed)
Alexis Stout , Thank you for taking time to come for your Medicare Wellness Visit. I appreciate your ongoing commitment to your health goals. Please review the following plan we discussed and let me know if I can assist you in the future.   Screening recommendations/referrals: Colonoscopy: Currently due, orders placed this visit  Mammogram: Currently due orders placed this visit Bone Density: Currently due orders placed on 03/07/2019 that are good until 03/06/2020  Recommended yearly ophthalmology/optometry visit for glaucoma screening and checkup Recommended yearly dental visit for hygiene and checkup  Vaccinations: Influenza vaccine: Currently due if you wish to receive you may do so at our office or your local pharmacy  Pneumococcal vaccine: Currently due you may receive your first dose at the next office visit  Tdap vaccine: Currently due, you may contact your insurance company to discuss cost or you may await and injury to receive  Shingles vaccine: Currently due for Shingrix, if you wish to receive we recommend that you do so at your local pharmacy as it will be less expensive     Advanced directives: Advance directive discussed with you today. Even though you declined this today please call our office should you change your mind and we can give you the proper paperwork for you to fill out.   Conditions/risks identified: Please try to incorporate at least 30 minutes per day of physical activity   Next appointment: 12/22/2019 @ 8:30 am with Dr. Martinique    Preventive Care 92 Years and Older, Female Preventive care refers to lifestyle choices and visits with your health care provider that can promote health and wellness. What does preventive care include?  A yearly physical exam. This is also called an annual well check.  Dental exams once or twice a year.  Routine eye exams. Ask your health care provider how often you should have your eyes checked.  Personal lifestyle choices,  including:  Daily care of your teeth and gums.  Regular physical activity.  Eating a healthy diet.  Avoiding tobacco and drug use.  Limiting alcohol use.  Practicing safe sex.  Taking low-dose aspirin every day.  Taking vitamin and mineral supplements as recommended by your health care provider. What happens during an annual well check? The services and screenings done by your health care provider during your annual well check will depend on your age, overall health, lifestyle risk factors, and family history of disease. Counseling  Your health care provider may ask you questions about your:  Alcohol use.  Tobacco use.  Drug use.  Emotional well-being.  Home and relationship well-being.  Sexual activity.  Eating habits.  History of falls.  Memory and ability to understand (cognition).  Work and work Statistician.  Reproductive health. Screening  You may have the following tests or measurements:  Height, weight, and BMI.  Blood pressure.  Lipid and cholesterol levels. These may be checked every 5 years, or more frequently if you are over 25 years old.  Skin check.  Lung cancer screening. You may have this screening every year starting at age 57 if you have a 30-pack-year history of smoking and currently smoke or have quit within the past 15 years.  Fecal occult blood test (FOBT) of the stool. You may have this test every year starting at age 64.  Flexible sigmoidoscopy or colonoscopy. You may have a sigmoidoscopy every 5 years or a colonoscopy every 10 years starting at age 29.  Hepatitis C blood test.  Hepatitis B blood test.  Sexually  transmitted disease (STD) testing.  Diabetes screening. This is done by checking your blood sugar (glucose) after you have not eaten for a while (fasting). You may have this done every 1-3 years.  Bone density scan. This is done to screen for osteoporosis. You may have this done starting at age 78.  Mammogram. This  may be done every 1-2 years. Talk to your health care provider about how often you should have regular mammograms. Talk with your health care provider about your test results, treatment options, and if necessary, the need for more tests. Vaccines  Your health care provider may recommend certain vaccines, such as:  Influenza vaccine. This is recommended every year.  Tetanus, diphtheria, and acellular pertussis (Tdap, Td) vaccine. You may need a Td booster every 10 years.  Zoster vaccine. You may need this after age 55.  Pneumococcal 13-valent conjugate (PCV13) vaccine. One dose is recommended after age 96.  Pneumococcal polysaccharide (PPSV23) vaccine. One dose is recommended after age 51. Talk to your health care provider about which screenings and vaccines you need and how often you need them. This information is not intended to replace advice given to you by your health care provider. Make sure you discuss any questions you have with your health care provider. Document Released: 01/18/2015 Document Revised: 09/11/2015 Document Reviewed: 10/23/2014 Elsevier Interactive Patient Education  2017 [place] Prevention in the Home Falls can cause injuries. They can happen to people of all ages. There are many things you can do to make your home safe and to help prevent falls. What can I do on the outside of my home?  Regularly fix the edges of walkways and driveways and fix any cracks.  Remove anything that might make you trip as you walk through a door, such as a raised step or threshold.  Trim any bushes or trees on the path to your home.  Use bright outdoor lighting.  Clear any walking paths of anything that might make someone trip, such as rocks or tools.  Regularly check to see if handrails are loose or broken. Make sure that both sides of any steps have handrails.  Any raised decks and porches should have guardrails on the edges.  Have any leaves, snow, or ice cleared  regularly.  Use sand or salt on walking paths during winter.  Clean up any spills in your garage right away. This includes oil or grease spills. What can I do in the bathroom?  Use night lights.  Install grab bars by the toilet and in the tub and shower. Do not use towel bars as grab bars.  Use non-skid mats or decals in the tub or shower.  If you need to sit down in the shower, use a plastic, non-slip stool.  Keep the floor dry. Clean up any water that spills on the floor as soon as it happens.  Remove soap buildup in the tub or shower regularly.  Attach bath mats securely with double-sided non-slip rug tape.  Do not have throw rugs and other things on the floor that can make you trip. What can I do in the bedroom?  Use night lights.  Make sure that you have a light by your bed that is easy to reach.  Do not use any sheets or blankets that are too big for your bed. They should not hang down onto the floor.  Have a firm chair that has side arms. You can use this for support while you get dressed.  Do not have throw rugs and other things on the floor that can make you trip. What can I do in the kitchen?  Clean up any spills right away.  Avoid walking on wet floors.  Keep items that you use a lot in easy-to-reach places.  If you need to reach something above you, use a strong step stool that has a grab bar.  Keep electrical cords out of the way.  Do not use floor polish or wax that makes floors slippery. If you must use wax, use non-skid floor wax.  Do not have throw rugs and other things on the floor that can make you trip. What can I do with my stairs?  Do not leave any items on the stairs.  Make sure that there are handrails on both sides of the stairs and use them. Fix handrails that are broken or loose. Make sure that handrails are as long as the stairways.  Check any carpeting to make sure that it is firmly attached to the stairs. Fix any carpet that is loose  or worn.  Avoid having throw rugs at the top or bottom of the stairs. If you do have throw rugs, attach them to the floor with carpet tape.  Make sure that you have a light switch at the top of the stairs and the bottom of the stairs. If you do not have them, ask someone to add them for you. What else can I do to help prevent falls?  Wear shoes that:  Do not have high heels.  Have rubber bottoms.  Are comfortable and fit you well.  Are closed at the toe. Do not wear sandals.  If you use a stepladder:  Make sure that it is fully opened. Do not climb a closed stepladder.  Make sure that both sides of the stepladder are locked into place.  Ask someone to hold it for you, if possible.  Clearly mark and make sure that you can see:  Any grab bars or handrails.  First and last steps.  Where the edge of each step is.  Use tools that help you move around (mobility aids) if they are needed. These include:  Canes.  Walkers.  Scooters.  Crutches.  Turn on the lights when you go into a dark area. Replace any light bulbs as soon as they burn out.  Set up your furniture so you have a clear path. Avoid moving your furniture around.  If any of your floors are uneven, fix them.  If there are any pets around you, be aware of where they are.  Review your medicines with your doctor. Some medicines can make you feel dizzy. This can increase your chance of falling. Ask your doctor what other things that you can do to help prevent falls. This information is not intended to replace advice given to you by your health care provider. Make sure you discuss any questions you have with your health care provider. Document Released: 10/18/2008 Document Revised: 05/30/2015 Document Reviewed: 01/26/2014 Elsevier Interactive Patient Education  2017 [employer].

## 2019-12-20 NOTE — Progress Notes (Signed)
Subjective:   Alexis Stout is a 64 y.o. female who presents for an Initial Medicare Annual Wellness Visit.   I connected with Alexis Stout today by telephone and verified that I am speaking with the correct person using two identifiers. Location patient: home Location provider: work Persons participating in the virtual visit: patient, provider.   I discussed the limitations, risks, security and privacy concerns of performing an evaluation and management service by telephone and the availability of in person appointments. I also discussed with the patient that there may be a patient responsible charge related to this service. The patient expressed understanding and verbally consented to this telephonic visit.    Interactive audio and video telecommunications were attempted between this provider and patient, however failed, due to patient having technical difficulties OR patient did not have access to video capability.  We continued and completed visit with audio only.     Review of Systems    N/A  Cardiac Risk Factors include: advanced age (>15men, >58 women);dyslipidemia;hypertension     Objective:    Today's Vitals   There is no height or weight on file to calculate BMI.  Advanced Directives 12/20/2019  Does Patient Have a Medical Advance Directive? No  Would patient like information on creating a medical advance directive? No - Patient declined    Current Medications (verified) Outpatient Encounter Medications as of 12/20/2019  Medication Sig  . atenolol-chlorthalidone (TENORETIC) 50-25 MG tablet Take 1 tablet by mouth daily.  . naproxen sodium (ANAPROX DS) 550 MG tablet Take 1 tablet (550 mg total) by mouth 2 (two) times daily with a meal.  . silver sulfADIAZINE (SILVADENE) 1 % cream Apply 1 application topically 2 (two) times daily. Clean site of wound and apply cream.  . tiZANidine (ZANAFLEX) 4 MG tablet Take 1 tablet (4 mg total) by mouth every 6 (six) hours as  needed for muscle spasms. (Patient not taking: Reported on 12/20/2019)   No facility-administered encounter medications on file as of 12/20/2019.    Allergies (verified) Patient has no known allergies.   History: Past Medical History:  Diagnosis Date  . Arthritis   . GERD (gastroesophageal reflux disease)   . Hyperlipidemia   . Hypertension    Past Surgical History:  Procedure Laterality Date  . ABDOMINAL HYSTERECTOMY    . COLONOSCOPY  last 10/25/2013  . POLYPECTOMY    . ROTATOR CUFF REPAIR Right    Family History  Problem Relation Age of Onset  . Heart attack Father   . Ovarian cancer Mother   . Breast cancer Sister 58  . Diabetes Sister        x 3  . Colon cancer Neg Hx   . Esophageal cancer Neg Hx   . Rectal cancer Neg Hx   . Stomach cancer Neg Hx   . Colon polyps Neg Hx    Social History   Socioeconomic History  . Marital status: Divorced    Spouse name: Not on file  . Number of children: 1  . Years of education: Not on file  . Highest education level: Not on file  Occupational History  . Occupation: property mgmt  Tobacco Use  . Smoking status: Never Smoker  . Smokeless tobacco: Never Used  Vaping Use  . Vaping Use: Never used  Substance and Sexual Activity  . Alcohol use: Yes    Alcohol/week: 3.0 standard drinks    Types: 3 Glasses of wine per week  . Drug use: Not Currently  .  Sexual activity: Never    Comment: socially  Other Topics Concern  . Not on file  Social History Narrative  . Not on file   Social Determinants of Health   Financial Resource Strain: Low Risk   . Difficulty of Paying Living Expenses: Not hard at all  Food Insecurity: No Food Insecurity  . Worried About Charity fundraiser in the Last Year: Never true  . Ran Out of Food in the Last Year: Never true  Transportation Needs: No Transportation Needs  . Lack of Transportation (Medical): No  . Lack of Transportation (Non-Medical): No  Physical Activity: Inactive  . Days  of Exercise per Week: 0 days  . Minutes of Exercise per Session: 0 min  Stress: No Stress Concern Present  . Feeling of Stress : Not at all  Social Connections: Moderately Integrated  . Frequency of Communication with Friends and Family: More than three times a week  . Frequency of Social Gatherings with Friends and Family: More than three times a week  . Attends Religious Services: More than 4 times per year  . Active Member of Clubs or Organizations: Yes  . Attends Archivist Meetings: More than 4 times per year  . Marital Status: Divorced    Tobacco Counseling Counseling given: Not Answered   Clinical Intake:  Pre-visit preparation completed: Yes  Pain : No/denies pain     Nutritional Risks: None Diabetes: No  How often do you need to have someone help you when you read instructions, pamphlets, or other written materials from your doctor or pharmacy?: 1 - Never What is the last grade level you completed in school?: 12th Grade  Diabetic?No   Interpreter Needed?: No  Information entered by :: [employer] Daily [employer] In your present state of health, do you have any difficulty performing the following activities: 12/20/2019  Hearing? N  Vision? N  Difficulty concentrating or making decisions? N  Walking or climbing stairs? N  Dressing or bathing? N  Doing errands, shopping? N  Preparing Food and eating ? N  Using the Toilet? N  In the past six months, have you accidently leaked urine? N  Do you have problems with loss of bowel control? N  Managing your Medications? N  Managing your Finances? N  Housekeeping or managing your Housekeeping? N  Some recent data might be hidden    Patient Care Team: Martinique, Betty G, MD as PCP - General (Family Medicine) [place], Betty G, MD as PCP - Family Medicine (Family Medicine)  Indicate any recent Medical Services you may have received from other than [facility] providers in the past year (date may be  approximate).     Assessment:   This is a routine wellness examination for [place].  Hearing/Vision screen  Hearing Screening   125Hz  250Hz  500Hz  1000Hz  2000Hz  3000Hz  4000Hz  6000Hz  8000Hz   Right ear:           Left ear:           Vision Screening Comments: Patient states gets eyes checked once per two years   Dietary issues and exercise activities discussed: Current Exercise Habits: The patient does not participate in regular exercise at present  Goals    . Exercise 150 min/wk Moderate Activity      Depression Screen PHQ 2/9 Scores 12/20/2019 06/14/2019 11/07/2018  PHQ - 2 Score 0 0 0  PHQ- 9 Score 0 - -    Fall Risk Fall Risk  12/20/2019 06/14/2019  Falls in the past year? 1 0  Number falls in past yr: 0 -  Injury with Fall? 1 -  Risk for fall due to : History of fall(s) -  Follow up Falls evaluation completed;Falls prevention discussed -    FALL RISK PREVENTION PERTAINING TO THE HOME:  Any stairs in or around the home? No  If so, are there any without handrails? No  Home free of loose throw rugs in walkways, pet beds, electrical cords, etc? Yes  Adequate lighting in your home to reduce risk of falls? Yes   ASSISTIVE DEVICES UTILIZED TO PREVENT FALLS:  Life alert? No  Use of a cane, walker or w/c? No  Grab bars in the bathroom? No  Shower chair or bench in shower? No  Elevated toilet seat or a handicapped toilet? No    Cognitive Function:   Normal cognitive status assessed by direct observation by this Nurse Health Advisor. No abnormalities found.        Immunizations Immunization History  Administered Date(s) Administered  . Influenza,inj,Quad PF,6+ Mos 08/30/2013  . PPD Test 03/03/2017  . Td 01/05/1998, 05/21/2009    TDAP status: Due, Education has been provided regarding the importance of this vaccine. Advised may receive this vaccine at local pharmacy or Health Dept. Aware to provide a copy of the vaccination record if obtained from local pharmacy or  Health Dept. Verbalized acceptance and understanding.  Flu Vaccine status: Declined, Education has been provided regarding the importance of this vaccine but patient still declined. Advised may receive this vaccine at local pharmacy or Health Dept. Aware to provide a copy of the vaccination record if obtained from local pharmacy or Health Dept. Verbalized acceptance and understanding.  Pneumococcal vaccine status: Due, Education has been provided regarding the importance of this vaccine. Advised may receive this vaccine at local pharmacy or Health Dept. Aware to provide a copy of the vaccination record if obtained from local pharmacy or Health Dept. Verbalized acceptance and understanding.  Covid-19 vaccine status: Information provided on how to obtain vaccines.   Qualifies for Shingles Vaccine? Yes   Zostavax completed No   Shingrix Completed?: No.    Education has been provided regarding the importance of this vaccine. Patient has been advised to call insurance company to determine out of pocket expense if they have not yet received this vaccine. Advised may also receive vaccine at local pharmacy or Health Dept. Verbalized acceptance and understanding.  Screening Tests Health Maintenance  Topic Date Due  . COVID-19 Vaccine (1) Never done  . COLONOSCOPY  10/27/2016  . DEXA SCAN  Never done  . TETANUS/TDAP  05/22/2019  . MAMMOGRAM  06/24/2019  . INFLUENZA VACCINE  08/06/2019  . PNA vac Low Risk Adult (1 of 2 - PCV13) 03/08/2020 (Originally 11/15/2018)  . Hepatitis C Screening  Completed    Health Maintenance  Health Maintenance Due  Topic Date Due  . COVID-19 Vaccine (1) Never done  . COLONOSCOPY  10/27/2016  . DEXA SCAN  Never done  . TETANUS/TDAP  05/22/2019  . MAMMOGRAM  06/24/2019  . INFLUENZA VACCINE  08/06/2019    Colorectal cancer screening: Type of screening: Colonoscopy. Completed 10/27/2013. Repeat every 3 years  Mammogram status: Ordered 12/20/2019. Pt provided with  contact info and advised to call to schedule appt.   Bone Density status: Ordered 03/07/2019. Pt provided with contact info and advised to call to schedule appt.  Lung Cancer Screening: (Low Dose CT Chest recommended if Age 75-80 years, 30 pack-year  currently smoking OR have quit w/in 15years.) does not qualify.   Lung Cancer Screening Referral: N/A   Additional Screening:  Hepatitis C Screening: does qualify; Completed 06/14/2019  Vision Screening: Recommended annual ophthalmology exams for early detection of glaucoma and other disorders of the eye. Is the patient up to date with their annual eye exam?  No  Who is the provider or what is the name of the office in which the patient attends annual eye exams? [place]  If pt is not established with a provider, would they like to be referred to a provider to establish care? No .   Dental Screening: Recommended annual dental exams for proper oral hygiene  Community Resource Referral / Chronic Care Management: CRR required this visit?  No   CCM required this visit?  No      Plan:     I have personally reviewed and noted the following in the patient's chart:   . Medical and social history . Use of alcohol, tobacco or illicit drugs  . Current medications and supplements . Functional ability and status . Nutritional status . Physical activity . Advanced directives . List of other physicians . Hospitalizations, surgeries, and ER visits in previous 12 months . Vitals . Screenings to include cognitive, depression, and falls . Referrals and appointments  In addition, I have reviewed and discussed with patient certain preventive protocols, quality metrics, and best practice recommendations. A written personalized care plan for preventive services as well as general preventive health recommendations were provided to patient.     Ofilia Neas, LPN   [id number]   Nurse Notes: None

## 2019-12-22 ENCOUNTER — Encounter: Payer: Self-pay | Admitting: Family Medicine

## 2019-12-22 ENCOUNTER — Ambulatory Visit (INDEPENDENT_AMBULATORY_CARE_PROVIDER_SITE_OTHER): Payer: Medicare HMO | Admitting: Family Medicine

## 2019-12-22 ENCOUNTER — Other Ambulatory Visit: Payer: Self-pay

## 2019-12-22 ENCOUNTER — Ambulatory Visit (INDEPENDENT_AMBULATORY_CARE_PROVIDER_SITE_OTHER): Payer: Medicare HMO

## 2019-12-22 VITALS — BP 150/84 | HR 65 | Temp 98.2°F | Resp 16 | Ht 64.0 in | Wt 156.2 lb

## 2019-12-22 DIAGNOSIS — I10 Essential (primary) hypertension: Secondary | ICD-10-CM | POA: Diagnosis not present

## 2019-12-22 DIAGNOSIS — M25572 Pain in left ankle and joints of left foot: Secondary | ICD-10-CM

## 2019-12-22 DIAGNOSIS — S91002D Unspecified open wound, left ankle, subsequent encounter: Secondary | ICD-10-CM | POA: Diagnosis not present

## 2019-12-22 DIAGNOSIS — R200 Anesthesia of skin: Secondary | ICD-10-CM | POA: Diagnosis not present

## 2019-12-22 MED ORDER — MUPIROCIN 2 % EX OINT: 1.0000 "application " | TOPICAL_OINTMENT | Freq: Two times a day (BID) | CUTANEOUS | 0 refills | Status: AC

## 2019-12-22 NOTE — Patient Instructions (Signed)
A few things to remember from today's visit:   Essential hypertension - Plan: BASIC METABOLIC PANEL WITH GFR  Wound of left ankle, subsequent encounter - Plan: CBC with Differential/Platelet, C-reactive protein, DG Ankle Complete Left, mupirocin ointment (BACTROBAN) 2 %  Left ankle pain, unspecified chronicity - Plan: CBC with Differential/Platelet, DG Ankle Complete Left  If you need refills please call your pharmacy. Do not use My Chart to request refills or for acute issues that need immediate attention.  Monitor blood pressure at home, goal is under 140/90. Take medication daily. Keep wound clean with soap and water. Final recommendations according to labs and imaging.   Please be sure medication list is accurate. If a new problem present, please set up appointment sooner than planned today.
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
## 2019-12-23 LAB — CBC WITH DIFFERENTIAL/PLATELET
Absolute Monocytes: 328 cells/uL (ref 200–950)
Basophils Absolute: 20 cells/uL (ref 0–200)
Basophils Relative: 0.3 %
Eosinophils Absolute: 281 cells/uL (ref 15–500)
Eosinophils Relative: 4.2 %
HCT: 42.3 % (ref 35.0–45.0)
Hemoglobin: 13.9 g/dL (ref 11.7–15.5)
Lymphs Abs: 1467 cells/uL (ref 850–3900)
MCH: 25.3 pg — ABNORMAL LOW (ref 27.0–33.0)
MCHC: 32.9 g/dL (ref 32.0–36.0)
MCV: 76.9 fL — ABNORMAL LOW (ref 80.0–100.0)
MPV: 9.8 fL (ref 7.5–12.5)
Monocytes Relative: 4.9 %
Neutro Abs: 4603 cells/uL (ref 1500–7800)
Neutrophils Relative %: 68.7 %
Platelets: 240 10*3/uL (ref 140–400)
RBC: 5.5 10*6/uL — ABNORMAL HIGH (ref 3.80–5.10)
RDW: 13.4 % (ref 11.0–15.0)
Total Lymphocyte: 21.9 %
WBC: 6.7 10*3/uL (ref 3.8–10.8)

## 2019-12-23 LAB — BASIC METABOLIC PANEL WITH GFR
BUN: 16 mg/dL (ref 7–25)
CO2: 30 mmol/L (ref 20–32)
Calcium: 9.5 mg/dL (ref 8.6–10.4)
Chloride: 107 mmol/L (ref 98–110)
Creat: 0.8 mg/dL (ref 0.50–0.99)
GFR, Est African American: 89 mL/min/{1.73_m2} (ref >=60)
GFR, Est Non African American: 77 mL/min/{1.73_m2} (ref >=60)
Glucose, Bld: 108 mg/dL — ABNORMAL HIGH (ref 65–99)
Potassium: 4.2 mmol/L (ref 3.5–5.3)
Sodium: 143 mmol/L (ref 135–146)

## 2019-12-23 LAB — C-REACTIVE PROTEIN: CRP: 1.4 mg/L (ref <8.0)

## 2019-12-27 ENCOUNTER — Telehealth: Payer: Self-pay | Admitting: Family Medicine

## 2019-12-27 DIAGNOSIS — I10 Essential (primary) hypertension: Secondary | ICD-10-CM

## 2019-12-27 MED ORDER — LOSARTAN POTASSIUM 25 MG PO TABS: 25.0000 mg | ORAL_TABLET | Freq: Every day | ORAL | 1 refills | Status: DC

## 2019-12-27 NOTE — Telephone Encounter (Signed)
Per pt call was told to monitor  BP; it continues to be high. 161 today last night 169. pt would like to know what the Dr advise her to do

## 2019-12-27 NOTE — Telephone Encounter (Signed)
Let's add Losartan 25 mg at bedtime. No changes in atenolol-chlorthalidone. BMP in 7 to 10 days recheck potassium. Thanks, BJ

## 2019-12-27 NOTE — Addendum Note (Signed)
Addended by: Rodrigo Ran on: 12/27/2019 02:53 PM   Modules accepted: Orders

## 2019-12-27 NOTE — Telephone Encounter (Signed)
I tried calling pt, unable to leave a voicemail since her mailbox is full. Will try again today.

## 2019-12-27 NOTE — Telephone Encounter (Signed)
I spoke with pt. She is aware of message below. Rx sent in & lab appointment made.

## 2020-01-02 ENCOUNTER — Other Ambulatory Visit: Payer: Self-pay

## 2020-01-03 ENCOUNTER — Other Ambulatory Visit: Payer: Self-pay

## 2020-01-03 ENCOUNTER — Other Ambulatory Visit (INDEPENDENT_AMBULATORY_CARE_PROVIDER_SITE_OTHER): Payer: Medicare HMO

## 2020-01-03 DIAGNOSIS — I10 Essential (primary) hypertension: Secondary | ICD-10-CM

## 2020-01-03 LAB — BASIC METABOLIC PANEL
BUN: 19 mg/dL (ref 6–23)
CO2: 28 mEq/L (ref 19–32)
Calcium: 9.2 mg/dL (ref 8.4–10.5)
Chloride: 107 mEq/L (ref 96–112)
Creatinine, Ser: 0.79 mg/dL (ref 0.40–1.20)
GFR: 78.1 mL/min (ref >60.00)
Glucose, Bld: 121 mg/dL — ABNORMAL HIGH (ref 70–99)
Potassium: 3.7 mEq/L (ref 3.5–5.1)
Sodium: 143 mEq/L (ref 135–145)

## 2020-01-08 ENCOUNTER — Telehealth: Payer: Self-pay | Admitting: Family Medicine

## 2020-01-08 DIAGNOSIS — I10 Essential (primary) hypertension: Secondary | ICD-10-CM

## 2020-01-11 NOTE — Telephone Encounter (Signed)
Patient is calling to check the status for blood pressure medication refill, please advise. CB is [phone]

## 2020-01-12 NOTE — Telephone Encounter (Signed)
Called patient to inform that prescriptions had been called into pharmacy.  Answered questions about b/p meds, also discussed lab results from 01/03/20 with patient.  She was upset about no one getting in contact with her sooner.  Explained it's a Event organiser workers, Social research officer, government and gave apologies

## 2020-01-18 ENCOUNTER — Other Ambulatory Visit: Payer: Self-pay | Admitting: Family Medicine

## 2020-01-18 DIAGNOSIS — I10 Essential (primary) hypertension: Secondary | ICD-10-CM

## 2020-03-07 ENCOUNTER — Other Ambulatory Visit: Payer: Self-pay

## 2020-03-07 ENCOUNTER — Ambulatory Visit (AMBULATORY_SURGERY_CENTER): Payer: Self-pay | Admitting: *Deleted

## 2020-03-07 VITALS — Ht 64.0 in | Wt 160.0 lb

## 2020-03-07 DIAGNOSIS — Z86010 Personal history of colonic polyps: Secondary | ICD-10-CM

## 2020-03-07 NOTE — Progress Notes (Signed)
Patient's pre-visit was done today over the phone with the patient due to COVID-19 pandemic. Name,DOB and address verified. Insurance verified. Patient denies any allergies to Eggs and Soy. Patient denies any problems with anesthesia/sedation. Patient denies taking diet pills or blood thinners. Packet of Prep instructions mailed to patient including a copy of a consent form and pre-procedure patient acknowledgement form (with envelope to mail back to us)-pt is aware. Prep coupon included. Patient understands to call us back with any questions or concerns.  Patient is aware of our care-partner policy and [id number]-71 safety protocol.

## 2020-03-21 ENCOUNTER — Encounter: Payer: Medicare HMO | Admitting: Internal Medicine

## 2020-05-01 ENCOUNTER — Telehealth: Payer: Self-pay | Admitting: Internal Medicine

## 2020-05-01 ENCOUNTER — Encounter: Payer: Self-pay | Admitting: Internal Medicine

## 2020-05-01 NOTE — Telephone Encounter (Signed)
Called to remind patient of procedure on May 08, 2020 she stated she had a death in the family and needed to cancel appointment.  Patient rescheduled to 07-24-20.

## 2020-05-06 ENCOUNTER — Encounter: Payer: Medicare HMO | Admitting: Internal Medicine

## 2020-05-09 ENCOUNTER — Other Ambulatory Visit: Payer: Self-pay | Admitting: Family Medicine

## 2020-05-09 DIAGNOSIS — Z1231 Encounter for screening mammogram for malignant neoplasm of breast: Secondary | ICD-10-CM

## 2020-06-11 DIAGNOSIS — R69 Illness, unspecified: Secondary | ICD-10-CM | POA: Diagnosis not present

## 2020-06-14 DIAGNOSIS — Z1152 Encounter for screening for COVID-19: Secondary | ICD-10-CM | POA: Diagnosis not present

## 2020-07-04 ENCOUNTER — Ambulatory Visit: Payer: Medicare HMO

## 2020-07-10 ENCOUNTER — Telehealth: Payer: Self-pay | Admitting: *Deleted

## 2020-07-10 NOTE — Telephone Encounter (Signed)
Pt states she had cancelled yesterday

## 2020-07-10 NOTE — Telephone Encounter (Signed)
Pt NOS to PV.  Attempted to reach pt- mailbox full; unable to leave message.

## 2020-07-24 ENCOUNTER — Encounter: Payer: Medicare HMO | Admitting: Internal Medicine

## 2020-08-23 ENCOUNTER — Other Ambulatory Visit: Payer: Self-pay

## 2020-08-23 ENCOUNTER — Ambulatory Visit
Admission: RE | Admit: 2020-08-23 | Discharge: 2020-08-23 | Disposition: A | Discharge status: Home / Self Care | Payer: Medicare HMO | Source: Ambulatory Visit | Attending: Family Medicine | Admitting: Family Medicine

## 2020-08-23 DIAGNOSIS — Z1231 Encounter for screening mammogram for malignant neoplasm of breast: Secondary | ICD-10-CM

## 2020-11-12 ENCOUNTER — Telehealth: Payer: Self-pay

## 2020-11-12 NOTE — Telephone Encounter (Signed)
Pt last OV 12/22/19 recommendation at that time was to f/u in 22mo for cpe & HTN. No appt noted. Pt agreeable to make appt for 11/26/20 for annual, HTN f/u.

## 2020-11-25 NOTE — Progress Notes (Signed)
HPI: [name] is a 65 y.o. female, who is here today for her routine physical.  Last CPE: 03/07/19  Regular exercise 3 or more time per week: Active at work, she does not have an exercise routine.  Following a healthy diet: She has not been consistent. She lives alone, family lives close by.  Chronic medical problems: HTN, OA, HLD  Immunization History  Administered Date(s) Administered   Influenza,inj,Quad PF,6+ Mos 08/30/2013   PPD Test 03/03/2017   Td 01/05/1998, 05/21/2009   Health Maintenance  Topic Date Due   COVID-19 Vaccine (1) 12/11/2020 (Originally 05/15/1956)   DEXA SCAN  01/10/2021 (Originally 11/15/2018)   COLONOSCOPY (Pts 45-13yrs Insurance coverage will need to be confirmed)  01/10/2021 (Originally 10/27/2016)   INFLUENZA VACCINE  04/04/2021 (Originally 08/05/2020)   Pneumonia Vaccine 42+ Years old (1 - PCV) 11/25/2021 (Originally 11/15/2018)   TETANUS/TDAP  01/10/2026 (Originally 05/22/2019)   Zoster Vaccines- Shingrix (1 of 2) 01/10/2026 (Originally 11/15/2003)   MAMMOGRAM  08/24/2022   Hepatitis C Screening  Completed   HPV VACCINES  Aged Out   She does not take Ca++ and vit D consistently. She cancelled appt for colonoscopy.   HTN:She is on Atenolol-HCTZ 50-25 mg daily. She has not taken her meds for a couple days, forgot med at her sister's house, got it and planning on resuming it today. She does not check BP.  Lab Results  Component Value Date   CREATININE 0.79 01/03/2020   BUN 19 01/03/2020   NA 143 01/03/2020   K 3.7 01/03/2020   CL 107 01/03/2020   CO2 28 01/03/2020   HLD: She is not on pharmacologic treatment. Aortic atherosclerosis has been seen on imaging, 08/2018.  Lab Results  Component Value Date   CHOL 305 (H) 03/07/2019   HDL 57.60 03/07/2019   LDLCALC 213 (H) 03/07/2019   LDLDIRECT 179.9 05/21/2009   TRIG 172.0 (H) 03/07/2019   CHOLHDL 5 03/07/2019   Drinks alcohol once per week, wine or liquior.  Review of  Systems  Constitutional:  Negative for appetite change, fatigue and fever.  HENT:  Negative for hearing loss, mouth sores, sore throat and trouble swallowing.   Eyes:  Negative for photophobia and visual disturbance.  Respiratory:  Negative for cough, shortness of breath and wheezing.   Cardiovascular:  Negative for chest pain and leg swelling.  Gastrointestinal:  Negative for abdominal pain, nausea and vomiting.       No changes in bowel habits.  Endocrine: Negative for cold intolerance and heat intolerance.  Genitourinary:  Negative for decreased urine volume, dysuria, hematuria, vaginal bleeding and vaginal discharge.  Musculoskeletal:  Negative for gait problem and myalgias.  Skin:  Negative for color change and rash.  Neurological:  Negative for seizures, syncope, weakness, numbness and headaches.  Hematological:  Negative for adenopathy. Does not bruise/bleed easily.  Psychiatric/Behavioral:  Negative for confusion. The patient is not nervous/anxious.   All other systems reviewed and are negative.  Current Outpatient Medications on File Prior to Visit  Medication Sig Dispense Refill   atenolol-chlorthalidone (TENORETIC) 50-25 MG tablet TAKE 1 TABLET BY MOUTH EVERY DAY 90 tablet 2   MAGNESIUM PO Take by mouth.     VITAMIN D PO Take by mouth.     No current facility-administered medications on file prior to visit.     Past Medical History:  Diagnosis Date   Arthritis    GERD (gastroesophageal reflux disease)    Hyperlipidemia    Hypertension  Past Surgical History:  Procedure Laterality Date   ABDOMINAL HYSTERECTOMY     COLONOSCOPY  last 10/25/2013   POLYPECTOMY     ROTATOR CUFF REPAIR Right    No Known Allergies  Family History  Problem Relation Age of Onset   Heart attack Father    Ovarian cancer Mother    Breast cancer Sister 70   Diabetes Sister        x 3   Colon cancer Neg Hx    Esophageal cancer Neg Hx    Rectal cancer Neg Hx    Stomach cancer Neg  Hx    Colon polyps Neg Hx    Social History   Socioeconomic History   Marital status: Divorced    Spouse name: Not on file   Number of children: 1   Years of education: Not on file   Highest education level: Not on file  Occupational History   Occupation: property mgmt  Tobacco Use   Smoking status: Never   Smokeless tobacco: Never  Vaping Use   Vaping Use: Never used  Substance and Sexual Activity   Alcohol use: Yes    Alcohol/week: 3.0 standard drinks    Types: 3 Glasses of wine per week   Drug use: Not Currently   Sexual activity: Never    Comment: socially  Other Topics Concern   Not on file  Social History Narrative   Not on file   Social Determinants of Health   Financial Resource Strain: Low Risk    Difficulty of Paying Living Expenses: Not hard at all  Food Insecurity: No Food Insecurity   Worried About Charity fundraiser in the Last Year: Never true   [place] in the Last Year: Never true  Transportation Needs: No Transportation Needs   Lack of Transportation (Medical): No   Lack of Transportation (Non-Medical): No  Physical Activity: Inactive   Days of Exercise per Week: 0 days   Minutes of Exercise per Session: 0 min  Stress: No Stress Concern Present   Feeling of Stress : Not at all  Social Connections: Moderately Integrated   Frequency of Communication with Friends and Family: More than three times a week   Frequency of Social Gatherings with Friends and Family: More than three times a week   Attends Religious Services: More than 4 times per year   Active Member of Clubs or Organizations: Yes   Attends Archivist Meetings: More than 4 times per year   Marital Status: Divorced   Vitals:   11/26/20 0906  BP: (!) 150/80  Pulse: 64  Resp: 16  Temp: 98.2 F (36.8 C)  SpO2: 98%   Body mass index is 28.53 kg/m.  Wt Readings from Last 3 Encounters:  11/26/20 166 lb 3.2 oz (75.4 kg)  03/07/20 160 lb (72.6 kg)  12/22/19 156 lb  3.2 oz (70.9 kg)   Physical Exam Vitals and nursing note reviewed.  Constitutional:      General: She is not in acute distress.    Appearance: She is well-developed.  HENT:     Head: Normocephalic and atraumatic.     Right Ear: Tympanic membrane, ear canal and external ear normal.     Left Ear: Tympanic membrane, ear canal and external ear normal.     Mouth/Throat:     Mouth: Mucous membranes are moist.     Pharynx: Oropharynx is clear. Uvula midline.  Eyes:     Extraocular Movements: Extraocular movements  intact.     Conjunctiva/sclera: Conjunctivae normal.     Pupils: Pupils are equal, round, and reactive to light.  Neck:     Thyroid: No thyromegaly.     Trachea: No tracheal deviation.  Cardiovascular:     Rate and Rhythm: Normal rate and regular rhythm.     Pulses:          Dorsalis pedis pulses are 2+ on the right side and 2+ on the left side.     Heart sounds: No murmur heard. Pulmonary:     Effort: Pulmonary effort is normal. No respiratory distress.     Breath sounds: Normal breath sounds.  Abdominal:     Palpations: Abdomen is soft. There is no hepatomegaly or mass.     Tenderness: There is no abdominal tenderness.  Genitourinary:    Comments: Breast: No masses, skin changes,or nipple discharge bilateral. Musculoskeletal:     Comments: No signs of synovitis appreciated.  Lymphadenopathy:     Cervical: No cervical adenopathy.     Upper Body:     Right upper body: No supraclavicular adenopathy.     Left upper body: No supraclavicular adenopathy.  Skin:    General: Skin is warm.     Findings: No erythema or rash.  Neurological:     General: No focal deficit present.     Mental Status: She is alert and oriented to person, place, and time.     Cranial Nerves: No cranial nerve deficit.     Coordination: Coordination normal.     Gait: Gait normal.     Deep Tendon Reflexes:     Reflex Scores:      Bicep reflexes are 2+ on the right side and 2+ on the left side.       Patellar reflexes are 2+ on the right side and 2+ on the left side. Psychiatric:        Speech: Speech normal.     Comments: Well groomed, good eye contact.   ASSESSMENT AND PLAN:  Alexis Stout was here today annual physical examination.  Orders Placed This Encounter  Procedures   DEXAScan   Comprehensive metabolic panel   Hemoglobin A1c   Lipid panel   TSH   Alexis Stout was seen today for annual exam.  Diagnoses and all orders for this visit: Orders Placed This Encounter  Procedures   DEXAScan   Comprehensive metabolic panel   Hemoglobin A1c   Lipid panel   TSH   Lab Results  Component Value Date   CHOL 309 (H) 11/26/2020   HDL 54.90 11/26/2020   LDLCALC 229 (H) 11/26/2020   LDLDIRECT 179.9 05/21/2009   TRIG 127.0 11/26/2020   CHOLHDL 6 11/26/2020    Lab Results  Component Value Date   TSH 0.62 11/26/2020   Lab Results  Component Value Date   CREATININE 0.83 11/26/2020   BUN 17 11/26/2020   NA 142 11/26/2020   K 4.0 11/26/2020   CL 106 11/26/2020   CO2 31 11/26/2020   Lab Results  Component Value Date   ALT 13 11/26/2020   AST 12 11/26/2020   ALKPHOS 98 11/26/2020   BILITOT 0.4 11/26/2020   Lab Results  Component Value Date   HGBA1C 6.5 11/26/2020   Routine general medical examination at a health care facility We discussed the importance of regular physical activity and healthy diet for prevention of chronic illness and/or complications. Preventive guidelines reviewed. Vaccination: Due for pneumovax, declined. Strongly recommend re-scheduling her colonoscopy.  Ca++ and vit D supplementation recommended. Next CPE in a year.  Asymptomatic postmenopausal estrogen deficiency -     DEXAScan; Future  Essential hypertension BP is not well controlled. Possible complications of elevated BP discussed. She will resume atenolol-Chlorthalidone same dose. Low salt diet. Monitor BP at home. Instructed about warning signs. Follow-up in 6 months,  before if needed..   Hyperlipidemia, mixed We discussed CV benefits of statin medications. Further recommendations after FLP results are back.  Atherosclerosis of aorta ([place]) We discussed imaging findings and prognosis. Statin medication will be recommended, waiting for FLP results. Aspirin 81 mg daily, side effects discussed.  Prediabetes A healthy life style encouraged for diabetes prevention.  Return in 6 months (on 05/26/2021) for HTN,prediabetes, and HLD.  Amreen Raczkowski G. [place], MD  [facility]. [place] office.

## 2020-11-26 ENCOUNTER — Ambulatory Visit (INDEPENDENT_AMBULATORY_CARE_PROVIDER_SITE_OTHER): Payer: Medicare HMO | Admitting: Family Medicine

## 2020-11-26 ENCOUNTER — Encounter: Payer: Self-pay | Admitting: Family Medicine

## 2020-11-26 VITALS — BP 150/80 | HR 64 | Temp 98.2°F | Resp 16 | Ht 64.0 in | Wt 166.2 lb

## 2020-11-26 DIAGNOSIS — I10 Essential (primary) hypertension: Secondary | ICD-10-CM

## 2020-11-26 DIAGNOSIS — R7303 Prediabetes: Secondary | ICD-10-CM | POA: Insufficient documentation

## 2020-11-26 DIAGNOSIS — I700 Atherosclerosis of aorta: Secondary | ICD-10-CM

## 2020-11-26 DIAGNOSIS — Z780 Asymptomatic menopausal state: Secondary | ICD-10-CM | POA: Diagnosis not present

## 2020-11-26 DIAGNOSIS — Z0000 Encounter for general adult medical examination without abnormal findings: Secondary | ICD-10-CM

## 2020-11-26 DIAGNOSIS — E782 Mixed hyperlipidemia: Secondary | ICD-10-CM | POA: Diagnosis not present

## 2020-11-26 LAB — COMPREHENSIVE METABOLIC PANEL
ALT: 13 U/L (ref 0–35)
AST: 12 U/L (ref 0–37)
Albumin: 4.3 g/dL (ref 3.5–5.2)
Alkaline Phosphatase: 98 U/L (ref 39–117)
BUN: 17 mg/dL (ref 6–23)
CO2: 31 mEq/L (ref 19–32)
Calcium: 9.5 mg/dL (ref 8.4–10.5)
Chloride: 106 mEq/L (ref 96–112)
Creatinine, Ser: 0.83 mg/dL (ref 0.40–1.20)
GFR: 73.15 mL/min (ref >60.00)
Glucose, Bld: 106 mg/dL — ABNORMAL HIGH (ref 70–99)
Potassium: 4 mEq/L (ref 3.5–5.1)
Sodium: 142 mEq/L (ref 135–145)
Total Bilirubin: 0.4 mg/dL (ref 0.2–1.2)
Total Protein: 7.5 g/dL (ref 6.0–8.3)

## 2020-11-26 LAB — LIPID PANEL
Cholesterol: 309 mg/dL — ABNORMAL HIGH (ref 0–200)
HDL: 54.9 mg/dL (ref >39.00)
LDL Cholesterol: 229 mg/dL — ABNORMAL HIGH (ref 0–99)
NonHDL: 254.24
Total CHOL/HDL Ratio: 6
Triglycerides: 127 mg/dL (ref 0.0–149.0)
VLDL: 25.4 mg/dL (ref 0.0–40.0)

## 2020-11-26 LAB — HEMOGLOBIN A1C: Hgb A1c MFr Bld: 6.5 % (ref 4.6–6.5)

## 2020-11-26 LAB — TSH: TSH: 0.62 u[IU]/mL (ref 0.35–5.50)

## 2020-11-26 NOTE — Patient Instructions (Addendum)
A few things to remember from today's visit:  Routine general medical examination at a health care facility  Hyperlipidemia, mixed  Essential hypertension  Atherosclerosis of aorta ([place])  Asymptomatic postmenopausal estrogen deficiency - Plan: DEXAScan  If you need refills please call your pharmacy. Do not use My Chart to request refills or for acute issues that need immediate attention.   Resume blood pressure medication. Monitor blood pressure at home. Baby aspirin daily, 81 mg. Will make recommendations about cholesterol medication according to labs.  Please be sure medication list is accurate. If a new problem present, please set up appointment sooner than planned today.  Health Maintenance, Female Adopting a healthy lifestyle and getting preventive care are important in promoting health and wellness. Ask your health care provider about: The right schedule for you to have regular tests and exams. Things you can do on your own to prevent diseases and keep yourself healthy. What should I know about diet, weight, and exercise? Eat a healthy diet  Eat a diet that includes plenty of vegetables, fruits, low-fat dairy products, and lean protein. Do not eat a lot of foods that are high in solid fats, added sugars, or sodium. Maintain a healthy weight Body mass index (BMI) is used to identify weight problems. It estimates body fat based on height and weight. Your health care provider can help determine your BMI and help you achieve or maintain a healthy weight. Get regular exercise Get regular exercise. This is one of the most important things you can do for your health. Most adults should: Exercise for at least 150 minutes each week. The exercise should increase your heart rate and make you sweat (moderate-intensity exercise). Do strengthening exercises at least twice a week. This is in addition to the moderate-intensity exercise. Spend less time sitting. Even light physical  activity can be beneficial. Watch cholesterol and blood lipids Have your blood tested for lipids and cholesterol at 67 years of age, then have this test every 5 years. Have your cholesterol levels checked more often if: Your lipid or cholesterol levels are high. You are older than 67 years of age. You are at high risk for heart disease. What should I know about cancer screening? Depending on your health history and family history, you may need to have cancer screening at various ages. This may include screening for: Breast cancer. Cervical cancer. Colorectal cancer. Skin cancer. Lung cancer. What should I know about heart disease, diabetes, and high blood pressure? Blood pressure and heart disease High blood pressure causes heart disease and increases the risk of stroke. This is more likely to develop in people who have high blood pressure readings or are overweight. Have your blood pressure checked: Every 3-5 years if you are 93-76 years of age. Every year if you are 38 years old or older. Diabetes Have regular diabetes screenings. This checks your fasting blood sugar level. Have the screening done: Once every three years after age 24 if you are at a normal weight and have a low risk for diabetes. More often and at a younger age if you are overweight or have a high risk for diabetes. What should I know about preventing infection? Hepatitis B If you have a higher risk for hepatitis B, you should be screened for this virus. Talk with your health care provider to find out if you are at risk for hepatitis B infection. Hepatitis C Testing is recommended for: Everyone born from [birth year] through 1967. Anyone with known risk factors for  hepatitis C. Sexually transmitted infections (STIs) Get screened for STIs, including gonorrhea and chlamydia, if: You are sexually active and are younger than 67 years of age. You are older than 67 years of age and your health care provider tells you that you  are at risk for this type of infection. Your sexual activity has changed since you were last screened, and you are at increased risk for chlamydia or gonorrhea. Ask your health care provider if you are at risk. Ask your health care provider about whether you are at high risk for HIV. Your health care provider may recommend a prescription medicine to help prevent HIV infection. If you choose to take medicine to prevent HIV, you should first get tested for HIV. You should then be tested every 3 months for as long as you are taking the medicine. Pregnancy If you are about to stop having your period (premenopausal) and you may become pregnant, seek counseling before you get pregnant. Take 400 to 800 micrograms (mcg) of folic acid every day if you become pregnant. Ask for birth control (contraception) if you want to prevent pregnancy. Osteoporosis and menopause Osteoporosis is a disease in which the bones lose minerals and strength with aging. This can result in bone fractures. If you are 28 years old or older, or if you are at risk for osteoporosis and fractures, ask your health care provider if you should: Be screened for bone loss. Take a calcium or vitamin D supplement to lower your risk of fractures. Be given hormone replacement therapy (HRT) to treat symptoms of menopause. Follow these instructions at home: Alcohol use Do not drink alcohol if: Your health care provider tells you not to drink. You are pregnant, may be pregnant, or are planning to become pregnant. If you drink alcohol: Limit how much you have to: 0-1 drink a day. Know how much alcohol is in your drink. In the [place] one drink equals one 12 oz bottle of beer (355 mL), one 5 oz glass of wine (148 mL), or one 1 oz glass of hard liquor (44 mL). Lifestyle Do not use any products that contain nicotine or tobacco. These products include cigarettes, chewing tobacco, and vaping devices, such as e-cigarettes. If you need help quitting,  ask your health care provider. Do not use street drugs. Do not share needles. Ask your health care provider for help if you need support or information about quitting drugs. General instructions Schedule regular health, dental, and eye exams. Stay current with your vaccines. Tell your health care provider if: You often feel depressed. You have ever been abused or do not feel safe at home. Summary Adopting a healthy lifestyle and getting preventive care are important in promoting health and wellness. Follow your health care provider's instructions about healthy diet, exercising, and getting tested or screened for diseases. Follow your health care provider's instructions on monitoring your cholesterol and blood pressure. This information is not intended to replace advice given to you by your health care provider. Make sure you discuss any questions you have with your health care provider. Document Revised: 05/13/2020 Document Reviewed: 05/13/2020 Elsevier Patient Education  [place].

## 2020-11-26 NOTE — Assessment & Plan Note (Signed)
A healthy life style encouraged for diabetes prevention.

## 2020-11-26 NOTE — Assessment & Plan Note (Addendum)
BP is not well controlled. Possible complications of elevated BP discussed. She will resume atenolol-Chlorthalidone same dose. Low salt diet. Monitor BP at home. Instructed about warning signs. Follow-up in 6 months, before if needed.Marland Kitchen

## 2020-11-26 NOTE — Assessment & Plan Note (Signed)
We discussed CV benefits of statin medications. Further recommendations after FLP results are back.

## 2020-11-26 NOTE — Assessment & Plan Note (Signed)
We discussed imaging findings and prognosis. Statin medication will be recommended, waiting for FLP results. Aspirin 81 mg daily, side effects discussed.

## 2020-11-29 MED ORDER — ATORVASTATIN CALCIUM 40 MG PO TABS: 40.0000 mg | ORAL_TABLET | Freq: Every day | ORAL | 2 refills | Status: DC

## 2021-01-07 ENCOUNTER — Telehealth: Payer: Self-pay | Admitting: Family Medicine

## 2021-01-07 DIAGNOSIS — E782 Mixed hyperlipidemia: Secondary | ICD-10-CM

## 2021-01-07 DIAGNOSIS — I700 Atherosclerosis of aorta: Secondary | ICD-10-CM

## 2021-01-07 MED ORDER — ATORVASTATIN CALCIUM 40 MG PO TABS: 40.0000 mg | ORAL_TABLET | Freq: Every day | ORAL | 2 refills | Status: DC

## 2021-01-07 NOTE — Telephone Encounter (Signed)
Patient called because the atorvastatin (LIPITOR) 40 MG tablet is too expensive, even with insurance. Patient states that she cannot afford it and she will need a cheaper alternative.   Please send to [facility] at [address], [place], [place] [address]       Please advise

## 2021-01-20 ENCOUNTER — Telehealth: Payer: Self-pay

## 2021-01-20 NOTE — Telephone Encounter (Signed)
This nurse attempted to call patient three times for virtual AWV. Called at 0810, 0815, and 0825. Unable to leave a message due to full voicemail box.

## 2021-01-27 ENCOUNTER — Telehealth: Payer: Self-pay

## 2021-01-27 DIAGNOSIS — E782 Mixed hyperlipidemia: Secondary | ICD-10-CM

## 2021-01-27 NOTE — Addendum Note (Signed)
Addended by: Rodrigo Ran on: 01/27/2021 08:42 AM   Modules accepted: Orders

## 2021-01-27 NOTE — Telephone Encounter (Signed)
I left patient a voicemail to call the office to schedule a lab appointment, needs to be fasting & orders are placed.

## 2021-01-27 NOTE — Telephone Encounter (Signed)
-----   Message from Rodrigo Ran, [place] sent at 12/02/2020 10:05 AM EST ----- Schedule FLP & ALT in 8 weeks.

## 2021-03-24 ENCOUNTER — Telehealth: Payer: Self-pay | Admitting: Family Medicine

## 2021-03-24 NOTE — Telephone Encounter (Signed)
Left message for patient to call back and schedule Medicare Annual Wellness Visit (AWV) either virtually or in office. Left  my Herbie Drape number [phone] ? ? ?Last AWV 12/20/19 ?; please schedule at anytime with [facility] Nurse Health Advisor 1 or 2 ? ? ?This should be a 45 minute visit.  ?

## 2021-04-24 ENCOUNTER — Ambulatory Visit (INDEPENDENT_AMBULATORY_CARE_PROVIDER_SITE_OTHER): Payer: Medicare PPO

## 2021-04-24 VITALS — Ht 64.0 in | Wt 166.0 lb

## 2021-04-24 DIAGNOSIS — Z1211 Encounter for screening for malignant neoplasm of colon: Secondary | ICD-10-CM

## 2021-04-24 DIAGNOSIS — Z0000 Encounter for general adult medical examination without abnormal findings: Secondary | ICD-10-CM

## 2021-04-24 NOTE — Patient Instructions (Addendum)
?Ms. Moskal , ?Thank you for taking time to come for your Medicare Wellness Visit. I appreciate your ongoing commitment to your health goals. Please review the following plan we discussed and let me know if I can assist you in the future.  ? ?These are the goals we discussed: ? Goals   ? ?   Exercise 150 min/wk Moderate Activity   ?   Weight (lb) < 200 lb (90.7 kg) (pt-stated)   ?   I want to lose about 20lbs ?  ? ?  ?  ?This is a list of the screening recommended for you and due dates:  ?Health Maintenance  ?Topic Date Due  ? Colon Cancer Screening  10/27/2016  ? DEXA scan (bone density measurement)  Never done  ? COVID-19 Vaccine (1) 05/10/2021*  ? Pneumonia Vaccine (1 - PCV) 11/25/2021*  ? Tetanus Vaccine  01/10/2026*  ? Zoster (Shingles) Vaccine (1 of 2) 01/10/2026*  ? Flu Shot  08/05/2021  ? Mammogram  08/24/2022  ? Hepatitis C Screening: USPSTF Recommendation to screen - Ages 7-79 yo.  Completed  ? HPV Vaccine  Aged Out  ?*Topic was postponed. The date shown is not the original due date.  ?  ?Advanced directives: No Patient deferred ? ?Conditions/risks identified: None ? ?Next appointment: Follow up in one year for your annual wellness visit  ? ? ?Preventive Care 12 Years and Older, Female ?Preventive care refers to lifestyle choices and visits with your health care provider that can promote health and wellness. ?What does preventive care include? ?A yearly physical exam. This is also called an annual well check. ?Dental exams once or twice a year. ?Routine eye exams. Ask your health care provider how often you should have your eyes checked. ?Personal lifestyle choices, including: ?Daily care of your teeth and gums. ?Regular physical activity. ?Eating a healthy diet. ?Avoiding tobacco and drug use. ?Limiting alcohol use. ?Practicing safe sex. ?Taking low-dose aspirin every day. ?Taking vitamin and mineral supplements as recommended by your health care provider. ?What happens during an annual well check? ?The  services and screenings done by your health care provider during your annual well check will depend on your age, overall health, lifestyle risk factors, and family history of disease. ?Counseling  ?Your health care provider may ask you questions about your: ?Alcohol use. ?Tobacco use. ?Drug use. ?Emotional well-being. ?Home and relationship well-being. ?Sexual activity. ?Eating habits. ?History of falls. ?Memory and ability to understand (cognition). ?Work and work Statistician. ?Reproductive health. ?Screening  ?You may have the following tests or measurements: ?Height, weight, and BMI. ?Blood pressure. ?Lipid and cholesterol levels. These may be checked every 5 years, or more frequently if you are over 50 years old. ?Skin check. ?Lung cancer screening. You may have this screening every year starting at age 77 if you have a 30-pack-year history of smoking and currently smoke or have quit within the past 15 years. ?Fecal occult blood test (FOBT) of the stool. You may have this test every year starting at age 86. ?Flexible sigmoidoscopy or colonoscopy. You may have a sigmoidoscopy every 5 years or a colonoscopy every 10 years starting at age 6. ?Hepatitis C blood test. ?Hepatitis B blood test. ?Sexually transmitted disease (STD) testing. ?Diabetes screening. This is done by checking your blood sugar (glucose) after you have not eaten for a while (fasting). You may have this done every 1-3 years. ?Bone density scan. This is done to screen for osteoporosis. You may have this done starting at age  65. ?Mammogram. This may be done every 1-2 years. Talk to your health care provider about how often you should have regular mammograms. ?Talk with your health care provider about your test results, treatment options, and if necessary, the need for more tests. ?Vaccines  ?Your health care provider may recommend certain vaccines, such as: ?Influenza vaccine. This is recommended every year. ?Tetanus, diphtheria, and acellular  pertussis (Tdap, Td) vaccine. You may need a Td booster every 10 years. ?Zoster vaccine. You may need this after age 69. ?Pneumococcal 13-valent conjugate (PCV13) vaccine. One dose is recommended after age 16. ?Pneumococcal polysaccharide (PPSV23) vaccine. One dose is recommended after age 81. ?Talk to your health care provider about which screenings and vaccines you need and how often you need them. ?This information is not intended to replace advice given to you by your health care provider. Make sure you discuss any questions you have with your health care provider. ?Document Released: 01/18/2015 Document Revised: 09/11/2015 Document Reviewed: 10/23/2014 ?Elsevier Interactive Patient Education ? 2017 [employer]. ? ?Fall Prevention in the Home ?Falls can cause injuries. They can happen to people of all ages. There are many things you can do to make your home safe and to help prevent falls. ?What can I do on the outside of my home? ?Regularly fix the edges of walkways and driveways and fix any cracks. ?Remove anything that might make you trip as you walk through a door, such as a raised step or threshold. ?Trim any bushes or trees on the path to your home. ?Use bright outdoor lighting. ?Clear any walking paths of anything that might make someone trip, such as rocks or tools. ?Regularly check to see if handrails are loose or broken. Make sure that both sides of any steps have handrails. ?Any raised decks and porches should have guardrails on the edges. ?Have any leaves, snow, or ice cleared regularly. ?Use sand or salt on walking paths during winter. ?Clean up any spills in your garage right away. This includes oil or grease spills. ?What can I do in the bathroom? ?Use night lights. ?Install grab bars by the toilet and in the tub and shower. Do not use towel bars as grab bars. ?Use non-skid mats or decals in the tub or shower. ?If you need to sit down in the shower, use a plastic, non-slip stool. ?Keep the floor  dry. Clean up any water that spills on the floor as soon as it happens. ?Remove soap buildup in the tub or shower regularly. ?Attach bath mats securely with double-sided non-slip rug tape. ?Do not have throw rugs and other things on the floor that can make you trip. ?What can I do in the bedroom? ?Use night lights. ?Make sure that you have a light by your bed that is easy to reach. ?Do not use any sheets or blankets that are too big for your bed. They should not hang down onto the floor. ?Have a firm chair that has side arms. You can use this for support while you get dressed. ?Do not have throw rugs and other things on the floor that can make you trip. ?What can I do in the kitchen? ?Clean up any spills right away. ?Avoid walking on wet floors. ?Keep items that you use a lot in easy-to-reach places. ?If you need to reach something above you, use a strong step stool that has a grab bar. ?Keep electrical cords out of the way. ?Do not use floor polish or wax that makes floors slippery.  If you must use wax, use non-skid floor wax. ?Do not have throw rugs and other things on the floor that can make you trip. ?What can I do with my stairs? ?Do not leave any items on the stairs. ?Make sure that there are handrails on both sides of the stairs and use them. Fix handrails that are broken or loose. Make sure that handrails are as long as the stairways. ?Check any carpeting to make sure that it is firmly attached to the stairs. Fix any carpet that is loose or worn. ?Avoid having throw rugs at the top or bottom of the stairs. If you do have throw rugs, attach them to the floor with carpet tape. ?Make sure that you have a light switch at the top of the stairs and the bottom of the stairs. If you do not have them, ask someone to add them for you. ?What else can I do to help prevent falls? ?Wear shoes that: ?Do not have high heels. ?Have rubber bottoms. ?Are comfortable and fit you well. ?Are closed at the toe. Do not wear  sandals. ?If you use a stepladder: ?Make sure that it is fully opened. Do not climb a closed stepladder. ?Make sure that both sides of the stepladder are locked into place. ?Ask someone to hold it for you, if pos

## 2021-04-24 NOTE — Progress Notes (Signed)
? ?Subjective:  ? Alexis Stout is a 66 y.o. female who presents for Medicare Annual (Subsequent) preventive examination. ? ?Review of Systems    ?Virtual Visit via Telephone Note ? ?I connected with  Alexis Stout on 04/24/21 at  8:45 AM EDT by telephone and verified that I am speaking with the correct person using two identifiers. ? ?Location: ?Patient: Home ?Provider: Office ?Persons participating in the virtual visit: patient/Nurse Health Advisor ?  ?I discussed the limitations, risks, security and privacy concerns of performing an evaluation and management service by telephone and the availability of in person appointments. The patient expressed understanding and agreed to proceed. ? ?Interactive audio and video telecommunications were attempted between this nurse and patient, however failed, due to patient having technical difficulties OR patient did not have access to video capability.  We continued and completed visit with audio only. ? ?Some vital signs may be absent or patient reported.  ? ?Criselda Peaches, LPN  ?Cardiac Risk Factors include: advanced age (>76mn, >>69women);hypertension ? ?   ?Objective:  ?  ?Today's Vitals  ? 04/24/21 0848  ?Weight: 166 lb (75.3 kg)  ?Height: '5\' 4"'$  (1.626 m)  ? ?Body mass index is 28.49 kg/m?. ? ? ?  04/24/2021  ?  8:55 AM 12/20/2019  ?  8:38 AM  ?Advanced Directives  ?Does Patient Have a Medical Advance Directive? No No  ?Would patient like information on creating a medical advance directive? No - Patient declined No - Patient declined  ? ? ?Current Medications (verified) ?Outpatient Encounter Medications as of 04/24/2021  ?Medication Sig  ? atenolol-chlorthalidone (TENORETIC) 50-25 MG tablet TAKE 1 TABLET BY MOUTH EVERY DAY  ? atorvastatin (LIPITOR) 40 MG tablet Take 1 tablet (40 mg total) by mouth daily.  ? MAGNESIUM PO Take by mouth.  ? VITAMIN D PO Take by mouth.  ? ?No facility-administered encounter medications on file as of 04/24/2021.  ? ? ?Allergies  (verified) ?Patient has no known allergies.  ? ?History: ?Past Medical History:  ?Diagnosis Date  ? Arthritis   ? GERD (gastroesophageal reflux disease)   ? Hyperlipidemia   ? Hypertension   ? ?Past Surgical History:  ?Procedure Laterality Date  ? ABDOMINAL HYSTERECTOMY    ? COLONOSCOPY  last 10/25/2013  ? POLYPECTOMY    ? ROTATOR CUFF REPAIR Right   ? ?Family History  ?Problem Relation Age of Onset  ? Heart attack Father   ? Ovarian cancer Mother   ? Breast cancer Sister [phone] ? Diabetes Sister   ?     x 3  ? Colon cancer Neg Hx   ? Esophageal cancer Neg Hx   ? Rectal cancer Neg Hx   ? Stomach cancer Neg Hx   ? Colon polyps Neg Hx   ? ?Social History  ? ?Socioeconomic History  ? Marital status: Divorced  ?  Spouse name: Not on file  ? Number of children: 1  ? Years of education: Not on file  ? Highest education level: Not on file  ?Occupational History  ? Occupation: property mgmt  ?Tobacco Use  ? Smoking status: Never  ? Smokeless tobacco: Never  ?Vaping Use  ? Vaping Use: Never used  ?Substance and Sexual Activity  ? Alcohol use: Yes  ?  Alcohol/week: 3.0 standard drinks  ?  Types: 3 Glasses of wine per week  ? Drug use: Not Currently  ? Sexual activity: Never  ?  Comment: socially  ?Other Topics Concern  ?  Not on file  ?Social History Narrative  ? Not on file  ? ?Social Determinants of Health  ? ?Financial Resource Strain: Low Risk   ? Difficulty of Paying Living Expenses: Not hard at all  ?Food Insecurity: No Food Insecurity  ? Worried About Charity fundraiser in the Last Year: Never true  ? Ran Out of Food in the Last Year: Never true  ?Transportation Needs: No Transportation Needs  ? Lack of Transportation (Medical): No  ? Lack of Transportation (Non-Medical): No  ?Physical Activity: Insufficiently Active  ? Days of Exercise per Week: 1 day  ? Minutes of Exercise per Session: 30 min  ?Stress: No Stress Concern Present  ? Feeling of Stress : Not at all  ?Social Connections: Moderately Integrated  ? Frequency of  Communication with Friends and Family: More than three times a week  ? Frequency of Social Gatherings with Friends and Family: More than three times a week  ? Attends Religious Services: More than 4 times per year  ? Active Member of Clubs or Organizations: Yes  ? Attends Archivist Meetings: More than 4 times per year  ? Marital Status: Divorced  ? ? ? ?Clinical Intake: ? ?Pre-visit preparation completed: NoDiabetic?  No ? ?Interpreter Needed?: NoActivities of Daily Living ? ?  04/24/2021  ?  8:54 AM  ?In your present state of health, do you have any difficulty performing the following activities:  ?Hearing? 0  ?Vision? 0  ?Difficulty concentrating or making decisions? 0  ?Walking or climbing stairs? 0  ?Dressing or bathing? 0  ?Doing errands, shopping? 0  ?Preparing Food and eating ? N  ?Using the Toilet? N  ?In the past six months, have you accidently leaked urine? N  ?Do you have problems with loss of bowel control? N  ?Managing your Medications? N  ?Managing your Finances? N  ?Housekeeping or managing your Housekeeping? N  ? ? ?Patient Care Team: ?Martinique, Betty G, MD as PCP - General (Family Medicine) ?[place], Betty G, MD as PCP - Family Medicine (Family Medicine) ? ?Indicate any recent Medical Services you may have received from other than [facility] providers in the past year (date may be approximate). ? ?   ?Assessment:  ? This is a routine wellness examination for Alexis Stout. ? ?Hearing/Vision screen ?Hearing Screening - Comments:: No difficulty hearing ?Vision Screening - Comments:: Wears reading glasses. Followed by [facility] ? ?Dietary issues and exercise activities discussed: ?Exercise limited by: None identified ? ? Goals Addressed   ? ?  ?  ?  ?  ?  ? This Visit's Progress  ?   Weight (lb) < 200 lb (90.7 kg) (pt-stated)   166 lb (75.3 kg)  ?   I want to lose about 20lbs ?  ? ?  ? ?Depression Screen ? ?  04/24/2021  ?  8:52 AM 11/26/2020  ?  9:12 AM 12/20/2019  ?  8:40 AM 06/14/2019  ?  3:56 PM  11/07/2018  ?  1:14 PM  ?PHQ 2/9 Scores  ?PHQ - 2 Score 0 0 0 0 0  ?PHQ- 9 Score   0    ?  ?Fall Risk ? ?  04/24/2021  ?  8:54 AM 11/26/2020  ?  8:27 PM 12/20/2019  ?  8:39 AM 06/14/2019  ?  3:56 PM  ?Fall Risk   ?Falls in the past year? 0 0 1 0  ?Number falls in past yr: 0 0 0   ?Injury with Fall?  0 0 1   ?Risk for fall due to : No Fall Risks  History of fall(s)   ?Follow up  Education provided Falls evaluation completed;Falls prevention discussed   ? ? ?FALL RISK PREVENTION PERTAINING TO THE HOME: ? ?Any stairs in or around the home? No  ?If so, are there any without handrails? No  ?Home free of loose throw rugs in walkways, pet beds, electrical cords, etc? Yes  ?Adequate lighting in your home to reduce risk of falls? Yes  ? ?ASSISTIVE DEVICES UTILIZED TO PREVENT FALLS: ? ?Life alert? No  ?Use of a cane, walker or w/c? No  ?Grab bars in the bathroom? No  ?Shower chair or bench in shower? No  ?Elevated toilet seat or a handicapped toilet? No  ? ?TIMED UP AND GO: ? ?Was the test performed? No . Audio Visit ? ?Cognitive Function: ?  ?  ? ?  04/24/2021  ?  8:55 AM  ?6CIT Screen  ?What Year? 0 points  ?What month? 0 points  ?What time? 0 points  ?Count back from 20 4 points  ?Months in reverse 0 points  ?Repeat phrase 0 points  ?Total Score 4 points  ? ? ?Immunizations ?Immunization History  ?Administered Date(s) Administered  ? Influenza,inj,Quad PF,6+ Mos 08/30/2013  ? PPD Test 03/03/2017  ? Td 01/05/1998, 05/21/2009  ? ? ?TDAP status: Due, Education has been provided regarding the importance of this vaccine. Advised may receive this vaccine at local pharmacy or Health Dept. Aware to provide a copy of the vaccination record if obtained from local pharmacy or Health Dept. Verbalized acceptance and understanding. ? ?Flu Vaccine status: Declined, Education has been provided regarding the importance of this vaccine but patient still declined. Advised may receive this vaccine at local pharmacy or Health Dept. Aware to provide  a copy of the vaccination record if obtained from local pharmacy or Health Dept. Verbalized acceptance and understanding. ? ?Pneumococcal vaccine status: Declined,  Education has been provided regarding the impor

## 2021-05-29 ENCOUNTER — Other Ambulatory Visit: Payer: Self-pay | Admitting: Family Medicine

## 2021-05-29 DIAGNOSIS — I10 Essential (primary) hypertension: Secondary | ICD-10-CM

## 2021-07-28 DIAGNOSIS — J019 Acute sinusitis, unspecified: Secondary | ICD-10-CM | POA: Diagnosis not present

## 2021-11-08 ENCOUNTER — Encounter (HOSPITAL_COMMUNITY): Payer: Self-pay | Admitting: *Deleted

## 2021-11-08 ENCOUNTER — Ambulatory Visit (HOSPITAL_COMMUNITY)
Admission: EM | Admit: 2021-11-08 | Discharge: 2021-11-08 | Disposition: A | Discharge status: Home / Self Care | Payer: Medicare PPO | Attending: Physician Assistant | Admitting: Physician Assistant

## 2021-11-08 ENCOUNTER — Ambulatory Visit (INDEPENDENT_AMBULATORY_CARE_PROVIDER_SITE_OTHER): Payer: Medicare PPO

## 2021-11-08 DIAGNOSIS — M545 Low back pain, unspecified: Secondary | ICD-10-CM

## 2021-11-08 DIAGNOSIS — M5441 Lumbago with sciatica, right side: Secondary | ICD-10-CM

## 2021-11-08 MED ORDER — LIDOCAINE 4 % EX PTCH: 1.0000 | MEDICATED_PATCH | CUTANEOUS | 0 refills | Status: DC

## 2021-11-08 MED ORDER — PREDNISONE 20 MG PO TABS: 40.0000 mg | ORAL_TABLET | Freq: Every day | ORAL | 0 refills | Status: AC

## 2021-11-08 MED ORDER — BACLOFEN 5 MG PO TABS: 5.0000 mg | ORAL_TABLET | Freq: Two times a day (BID) | ORAL | 0 refills | Status: DC | PRN

## 2021-11-08 NOTE — ED Triage Notes (Signed)
Pt reports she turned over in the bed this AM feeling sudden onset right thoracic back pain radiating down into right lower back and into RLE. States she was raking leaves yesterday without difficulty. States had similar situation approx 2 wks ago. Has taken IBU with slight improvement. Denies any parasthesias.

## 2021-11-08 NOTE — Discharge Instructions (Addendum)
Your x-ray was normal with no evidence of broken bones or abnormal alignment.  I believe that you have muscle spasms that are causing pressure on your sciatic nerve.  Start prednisone 40 mg for 4 days.  Do not take NSAIDs with this medication including aspirin, ibuprofen/Advil, naproxen/Aleve.  You can use Tylenol/acetaminophen for additional symptom relief.  Take baclofen up to twice a day.  This is a muscle relaxer and will make you sleepy so do not drive or drink alcohol while taking this medication.  It is okay to take it just at night.  Use lidocaine patches for additional symptom relief.  Place 1 patch on your skin for 12 hours and then remove it for 12 hours.  Use only 1 patch per 24 hours.  If your symptoms are improving quickly please follow-up with sports medicine; call to schedule an appointment.  They can help arrange additional testing including evaluation by physical therapy.  If you have any worsening symptoms including increased pain, lower extremity weakness, going to the bathroom on yourself without noticing it, numbness on the inside of your legs you need to go to the emergency room immediately.
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
## 2022-01-20 ENCOUNTER — Telehealth: Payer: Self-pay | Admitting: Family Medicine

## 2022-01-20 DIAGNOSIS — M21619 Bunion of unspecified foot: Secondary | ICD-10-CM

## 2022-01-20 NOTE — Telephone Encounter (Signed)
Referral placed.

## 2022-01-20 NOTE — Telephone Encounter (Signed)
Pt called to request a referral to see a "specialist that can remove her bunions"  LOV:  11/26/20  Please advise.

## 2022-01-27 ENCOUNTER — Ambulatory Visit: Payer: Medicare PPO | Admitting: Podiatry

## 2022-01-27 ENCOUNTER — Ambulatory Visit (INDEPENDENT_AMBULATORY_CARE_PROVIDER_SITE_OTHER): Payer: Medicare PPO

## 2022-01-27 DIAGNOSIS — M2041 Other hammer toe(s) (acquired), right foot: Secondary | ICD-10-CM

## 2022-01-27 DIAGNOSIS — M21619 Bunion of unspecified foot: Secondary | ICD-10-CM | POA: Diagnosis not present

## 2022-01-27 DIAGNOSIS — E559 Vitamin D deficiency, unspecified: Secondary | ICD-10-CM | POA: Diagnosis not present

## 2022-01-27 DIAGNOSIS — M21612 Bunion of left foot: Secondary | ICD-10-CM

## 2022-01-27 DIAGNOSIS — M21961 Unspecified acquired deformity of right lower leg: Secondary | ICD-10-CM | POA: Diagnosis not present

## 2022-01-27 DIAGNOSIS — M21611 Bunion of right foot: Secondary | ICD-10-CM | POA: Diagnosis not present

## 2022-01-27 DIAGNOSIS — M2011 Hallux valgus (acquired), right foot: Secondary | ICD-10-CM

## 2022-01-27 NOTE — Progress Notes (Signed)
  Subjective:  Patient ID: Alexis Stout, female    DOB: 1955/06/26,  MRN: [id number]  Chief Complaint  Patient presents with   Bunions    Rm 4 Right bunion pain x 1 year. Pt states she has had bunions for years but pain has presented itself in the past year. Sharp pains. No edema, tingling or numbness. Pt states left bunion is not as bad but more focused on right.     67 y.o. female presents with the above complaint. History confirmed with patient.   Objective:  Physical Exam: warm, good capillary refill, no trophic changes or ulcerative lesions, normal DP and PT pulses, normal sensory exam, and bilaterally she has hallux valgus deformities large bunions medially, under Stout the second toe, prominent callus submetatarsal 2 bilateral, hammertoe of second toe.   Radiographs: Multiple views x-ray of both feet: Significant hallux valgus deformity with increased intermetatarsal angle and hallux abductus angle with degenerative changes noted in the first MTPJ, elongated second metatarsal with crossover toe deformity and hammertoe contracture Assessment:   1. Bunion   2. Vitamin D deficiency   3. Hallux valgus with bunions, right   4. Metatarsal deformity, right   5. Hammertoe of right foot      Plan:  Patient was evaluated and treated and all questions answered.   Discussed the etiology and treatment including surgical and non surgical treatment for painful bunions and hammertoes.  She has exhausted all non surgical treatment prior to this visit including shoe gear changes and padding.  She desires surgical intervention. We discussed all risks including but not limited to: pain, swelling, infection, scar, numbness which may be temporary or permanent, chronic pain, stiffness, nerve pain or damage, wound healing problems, bone healing problems including delayed or non-union and recurrence. Specifically we discussed the following procedures: First MPJ arthrodesis with bone graft from  the right heel, Weil osteotomy and second hammertoe correction.  We discussed the rationale for each of these procedures.  We discussed that correction of the bunion with Lapidus or osteotomy likely would correct her deformity but arthritis likely will be progressive and lead to continued worsening discomfort.  All questions were addressed informed consent was signed today. Surgery will be scheduled at a mutually agreeable date. Information regarding this will be forwarded to our surgery scheduler.  Vitamin D and calcium levels ordered   Surgical plan:  Procedure: -Right foot first MPJ fusion, second hammertoe correction, Weil osteotomy, bone graft from heel  Location: -[facility]  Anesthesia plan: -IV sedation with regional block  Postoperative pain plan: - Tylenol 1000 mg every 6 hours, ibuprofen 600 mg every 6 hours, gabapentin 300 mg every 8 hours x5 days, oxycodone 5 mg 1-2 tabs every 6 hours only as needed  DVT prophylaxis: -None required  WB Restrictions / DME needs: -NWB in splint postop  No follow-ups on file.

## 2022-01-30 ENCOUNTER — Telehealth: Payer: Self-pay | Admitting: Podiatry

## 2022-01-30 NOTE — Telephone Encounter (Signed)
DOS: 02/20/2022  [employer] Medicare Effective  Metatarsal Osteotomy 2nd Rt (76808) Hammertoe Repair 2nd Rt (81103) Hallux MPJ Fusion Rt (15945) Bone Graft Rt (20900)  Deductible: $350 with $0 met Out-of-Pocket: $8,300 with $0 met CoInsurance: 0%  Authorization #: [id number] Tracking #: [id number] Authorization Valid: 02/20/2022 only  Prior authorization is not required for CPT code 20900 per [employer] website.

## 2022-01-31 ENCOUNTER — Other Ambulatory Visit: Payer: Self-pay

## 2022-01-31 ENCOUNTER — Encounter (HOSPITAL_COMMUNITY): Payer: Self-pay | Admitting: Emergency Medicine

## 2022-01-31 ENCOUNTER — Ambulatory Visit (HOSPITAL_COMMUNITY)
Admission: EM | Admit: 2022-01-31 | Discharge: 2022-01-31 | Disposition: A | Discharge status: Home / Self Care | Payer: Medicare PPO | Attending: Physician Assistant | Admitting: Physician Assistant

## 2022-01-31 DIAGNOSIS — M62838 Other muscle spasm: Secondary | ICD-10-CM

## 2022-01-31 MED ORDER — KETOROLAC TROMETHAMINE 30 MG/ML IJ SOLN
30.0000 mg | Freq: Once | INTRAMUSCULAR | Status: AC
  Administered 2022-01-31: 30 mg via INTRAMUSCULAR

## 2022-01-31 MED ORDER — KETOROLAC TROMETHAMINE 30 MG/ML IJ SOLN
INTRAMUSCULAR | Status: AC
  Filled 2022-01-31: qty 1

## 2022-01-31 MED ORDER — BACLOFEN 10 MG PO TABS: 10.0000 mg | ORAL_TABLET | Freq: Three times a day (TID) | ORAL | 0 refills | Status: AC | PRN

## 2022-01-31 NOTE — Discharge Instructions (Signed)
Take baclofen as needed for muscle spasm. Recommend ice to affected area, gentle stretching. Recommend ibuprofen as needed. Turn if you develop new or worsening symptoms.

## 2022-01-31 NOTE — ED Triage Notes (Addendum)
Complains of neck pain for 4 days.  Woke one morning with neck pain.  Reports pain initially neck, now radiating into top of shoulders.  Turning her head either way is painful.  Prior to this , had a 24 hour vomiting and diarrhea episode.  This resolved, but neck issue became prominent.  denies runny nose or cough.  Moving head front and backwards, has the sensation of feeling or hearing liquid moving  Denies extremity weakness   Took a generic pain pill yesterday
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
## 2022-02-11 ENCOUNTER — Telehealth: Payer: Self-pay

## 2022-02-11 NOTE — Telephone Encounter (Signed)
Arika called to cancel her surgery with Dr. Sherryle Lis on 02/20/2022. She stated she has some court dates coming up and doesn't know when she will be available for surgery. She will call me back to reschedule. Notified Dr. Sherryle Lis and Caren Griffins with [facility]

## 2022-02-23 ENCOUNTER — Other Ambulatory Visit: Payer: Self-pay | Admitting: Family Medicine

## 2022-02-23 DIAGNOSIS — Z1231 Encounter for screening mammogram for malignant neoplasm of breast: Secondary | ICD-10-CM

## 2022-02-26 ENCOUNTER — Encounter: Payer: Medicare PPO | Admitting: Podiatry

## 2022-03-12 ENCOUNTER — Ambulatory Visit: Payer: Medicare PPO

## 2022-04-02 ENCOUNTER — Encounter: Payer: Medicare PPO | Admitting: Podiatry

## 2022-04-08 ENCOUNTER — Ambulatory Visit
Admission: RE | Admit: 2022-04-08 | Discharge: 2022-04-08 | Disposition: A | Discharge status: Home / Self Care | Payer: No Typology Code available for payment source | Source: Ambulatory Visit | Attending: Family Medicine | Admitting: Family Medicine

## 2022-04-08 DIAGNOSIS — Z1231 Encounter for screening mammogram for malignant neoplasm of breast: Secondary | ICD-10-CM | POA: Diagnosis not present

## 2022-04-19 IMAGING — MG MM DIGITAL SCREENING BILAT W/ TOMO AND CAD
8 series · 8 of 24 positions shown · non-contrast
Comparison: Previous exam(s).

CLINICAL DATA: Screening.

EXAM:
DIGITAL SCREENING BILATERAL MAMMOGRAM WITH TOMOSYNTHESIS AND CAD
TECHNIQUE: Bilateral screening digital craniocaudal and mediolateral oblique
mammograms were obtained. Bilateral screening digital breast
tomosynthesis was performed. The images were evaluated with
computer-aided detection.

[R CC synth-2D]
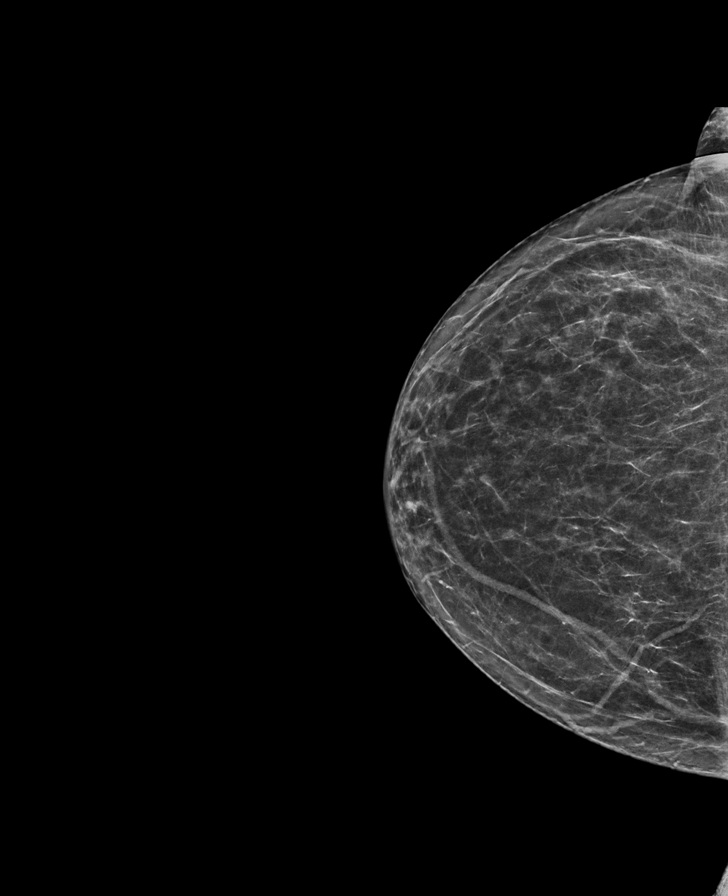

[L CC synth-2D]
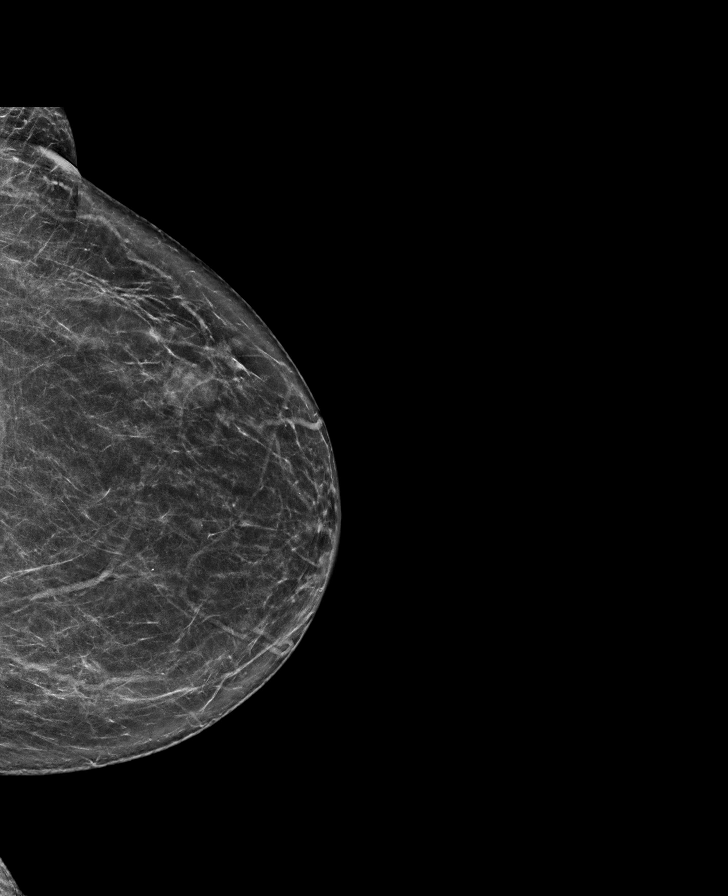

[R MLO synth-2D]
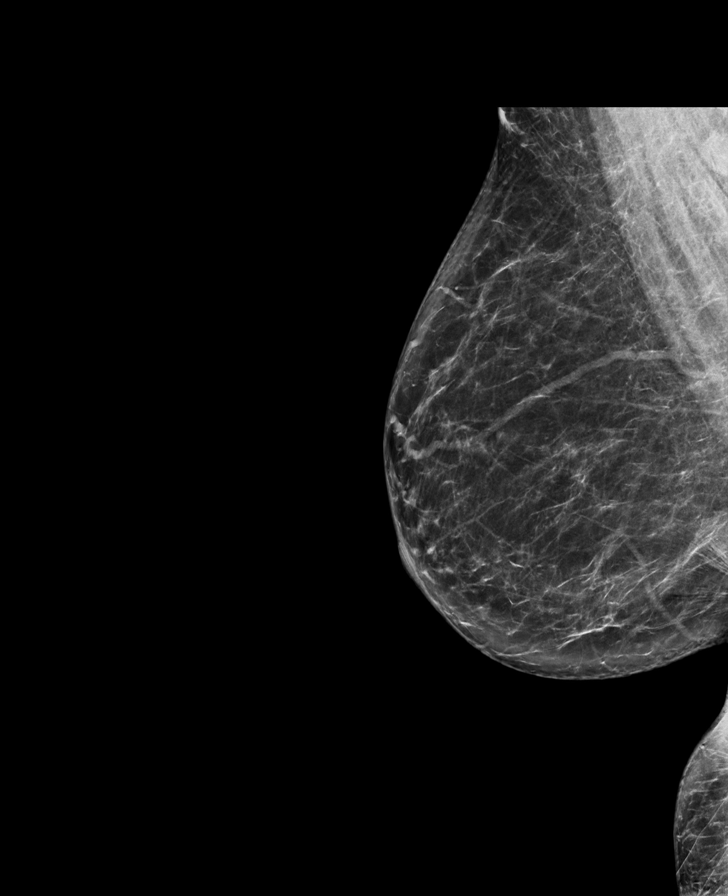

[L MLO synth-2D]
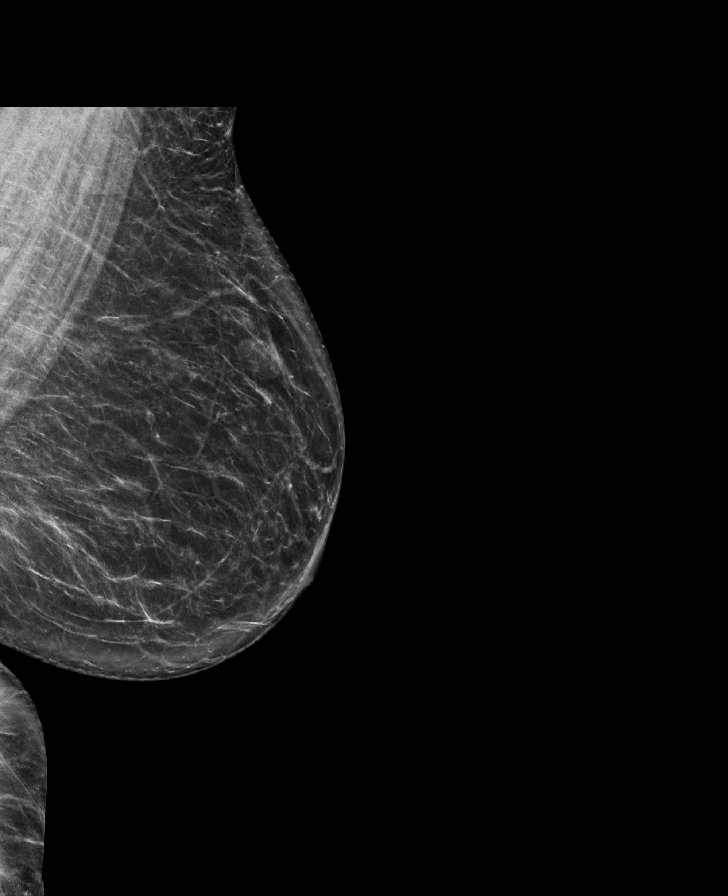

[L CC tomo · tomo slice 35/69.0]
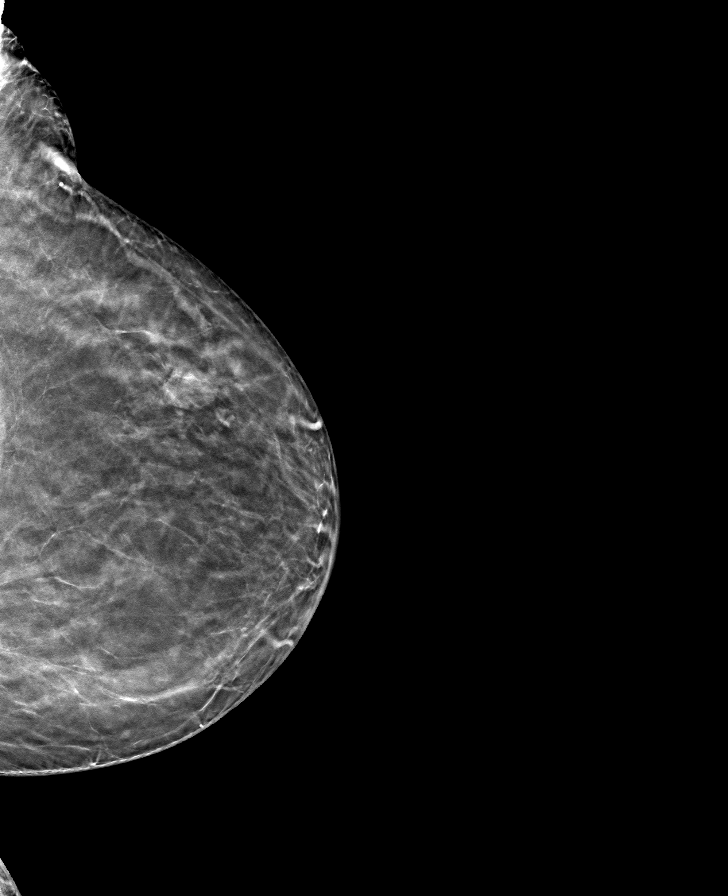

[L MLO tomo · tomo slice 39/78.0]
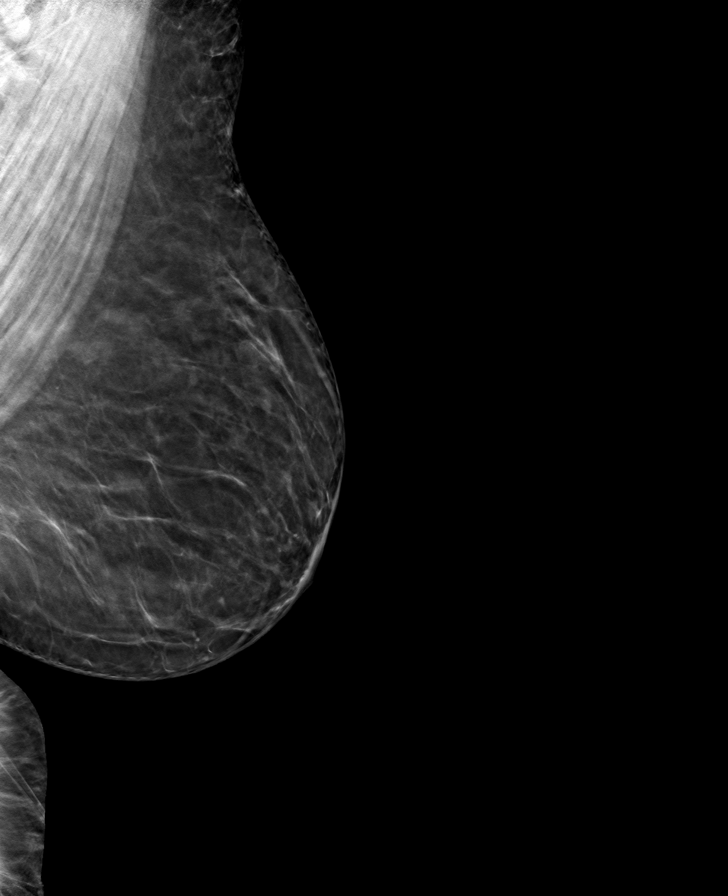

[R CC tomo · tomo slice 34/67.0]
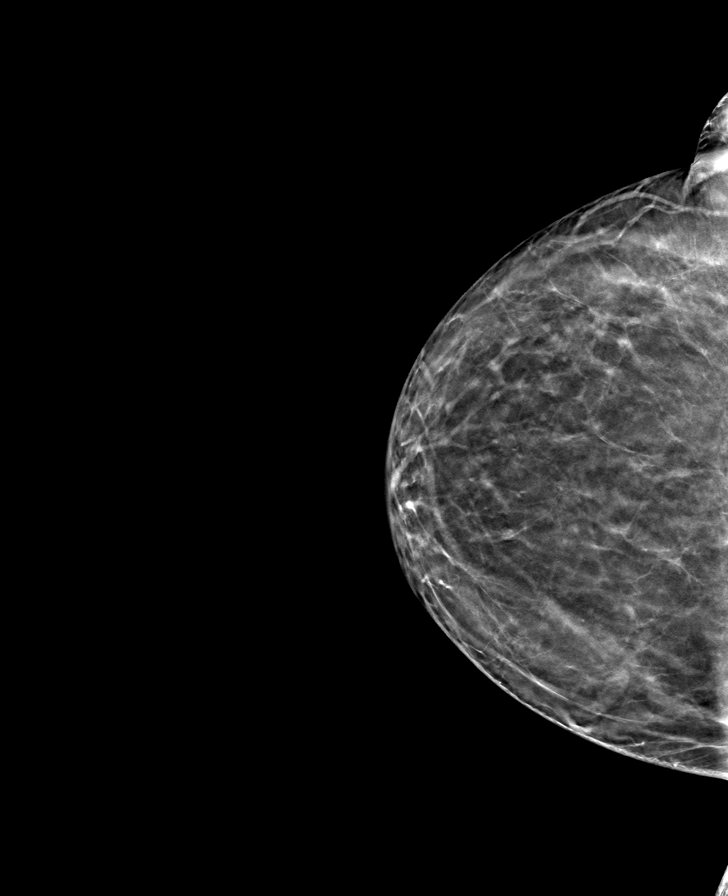

[R MLO tomo · tomo slice 41/80.0]
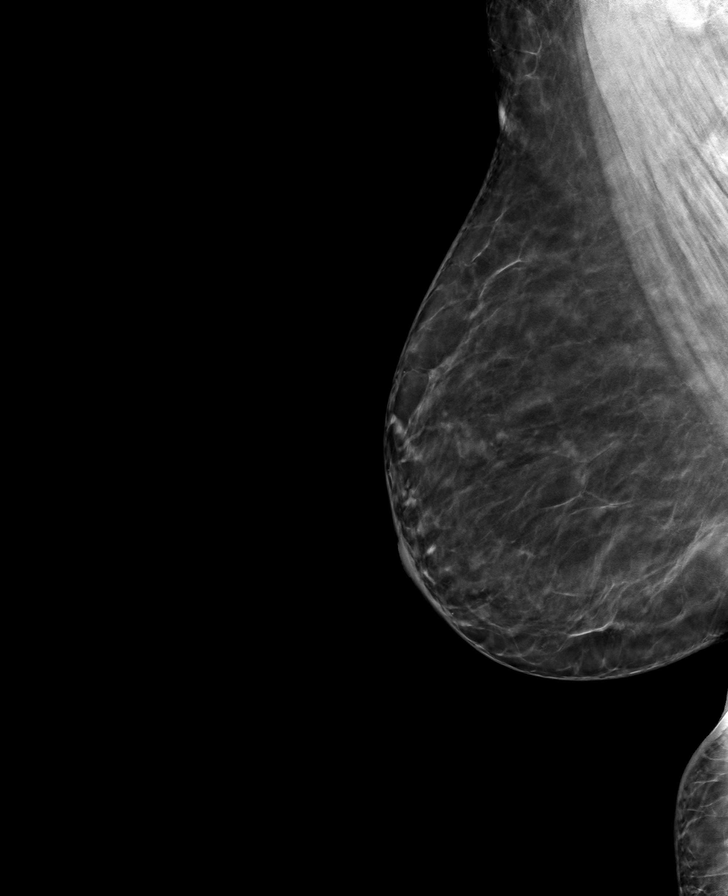

[8 of 24 positions shown; findings below may reference images not displayed]

ACR Breast Density Category b: There are scattered areas of
fibroglandular density.
FINDINGS: There are no findings suspicious for malignancy.
IMPRESSION: No mammographic evidence of malignancy. A result letter of this
screening mammogram will be mailed directly to the patient.

RECOMMENDATION:
Screening mammogram in one year. (Code:51-O-LD2)

BI-RADS CATEGORY  1: Negative.

## 2022-04-22 ENCOUNTER — Telehealth: Payer: Self-pay

## 2022-04-22 NOTE — Telephone Encounter (Signed)
Unsuccessful attempt to reach patient on preferred number listed in notes for scheduled AWV. Left message on voicemail okay to reschedule. 

## 2022-07-27 ENCOUNTER — Other Ambulatory Visit: Payer: Self-pay | Admitting: Family Medicine

## 2022-07-27 DIAGNOSIS — I10 Essential (primary) hypertension: Secondary | ICD-10-CM

## 2022-07-29 NOTE — Progress Notes (Unsigned)
ACUTE VISIT Chief Complaint  Patient presents with   Neck Pain    X 3 weeks, no known injury. Radiates up into head at times   HPI: Alexis Stout is a 67 y.o. female, who is here today complaining of  bilateral neck pain for the past three weeks, sometimes radiated to occipital and temporal areas.  The pain is described as annoying and bothersome, with a severity of 9 out of 10.  She denies any numbness or tingling in the arms.  Neck Pain  This is a new problem. The current episode started 1 to 4 weeks ago. The problem occurs intermittently. The problem has been gradually worsening. The pain is severe. The symptoms are aggravated by position. Stiffness is present In the morning. Associated symptoms include headaches. Pertinent negatives include no chest pain, fever, leg pain, numbness, pain with swallowing, paresis, photophobia, syncope, tingling, trouble swallowing, visual change, weakness or weight loss. She has tried acetaminophen for the symptoms. The treatment provided mild relief.   She has a history of neck pain since January 2024 and has tried changing pillows to alleviate the discomfort. There is no history of trauma or unusual activity around the time the pain started. Evaluated in the ED on 01/31/2022 for neck pain.  She was last seen on 11/26/2020.  Hypertension: She is on Atenolol-HCTZ 50-25 mg daily.  Her blood pressure readings at home have been variable, with systolic readings ranging from 140 to 170 mmHg and diastolic readings around 85 mmHg. Negative for unusual or severe headache, visual changes, exertional chest pain, dyspnea,  focal weakness, or edema.  Lab Results  Component Value Date   CREATININE 0.83 11/26/2020   BUN 17 11/26/2020   NA 142 11/26/2020   K 4.0 11/26/2020   CL 106 11/26/2020   CO2 31 11/26/2020  Her last eye exam was a while ago.  HLD: She has not been taking Atorvastatin for approximately one year. Lab Results  Component Value Date    CHOL 309 (H) 11/26/2020   HDL 54.90 11/26/2020   LDLCALC 229 (H) 11/26/2020   LDLDIRECT 179.9 05/21/2009   TRIG 127.0 11/26/2020   CHOLHDL 6 11/26/2020   Prediabetes:Negative for polydipsia,polyuria, or polyphagia.  Lab Results  Component Value Date   HGBA1C 6.5 11/26/2020   Review of Systems  Constitutional:  Negative for fever and weight loss.  HENT:  Negative for trouble swallowing.   Eyes:  Negative for photophobia.  Cardiovascular:  Negative for chest pain and syncope.  Musculoskeletal:  Positive for neck pain.  Neurological:  Positive for headaches. Negative for tingling, weakness and numbness.  See other pertinent positives and negatives in HPI.  Current Outpatient Medications on File Prior to Visit  Medication Sig Dispense Refill   atenolol-chlorthalidone (TENORETIC) 50-25 MG tablet Take 1 tablet by mouth daily. Needs follow up. 30 tablet 0   atorvastatin (LIPITOR) 40 MG tablet Take 1 tablet (40 mg total) by mouth daily. 90 tablet 2   No current facility-administered medications on file prior to visit.    Past Medical History:  Diagnosis Date   Arthritis    GERD (gastroesophageal reflux disease)    Hyperlipidemia    Hypertension    No Known Allergies  Social History   Socioeconomic History   Marital status: Divorced    Spouse name: Not on file   Number of children: 1   Years of education: Not on file   Highest education level: Not on file  Occupational History  Occupation: property mgmt  Tobacco Use   Smoking status: Never   Smokeless tobacco: Never  Vaping Use   Vaping status: Never Used  Substance and Sexual Activity   Alcohol use: Yes    Alcohol/week: 3.0 standard drinks of alcohol    Types: 3 Glasses of wine per week   Drug use: Never   Sexual activity: Not on file    Comment: socially  Other Topics Concern   Not on file  Social History Narrative   Not on file   Social Determinants of Health   Financial Resource Strain: Low Risk   (04/24/2021)   Overall Financial Resource Strain (CARDIA)    Difficulty of Paying Living Expenses: Not hard at all  Food Insecurity: No Food Insecurity (04/24/2021)   Hunger Vital Sign    Worried About Running Out of Food in the Last Year: Never true    Ran Out of Food in the Last Year: Never true  Transportation Needs: No Transportation Needs (04/24/2021)   PRAPARE - Administrator, Civil Service (Medical): No    Lack of Transportation (Non-Medical): No  Physical Activity: Insufficiently Active (04/24/2021)   Exercise Vital Sign    Days of Exercise per Week: 1 day    Minutes of Exercise per Session: 30 min  Stress: No Stress Concern Present (04/24/2021)   [employer] of Occupational Health - Occupational Stress Questionnaire    Feeling of Stress : Not at all  Social Connections: Moderately Integrated (04/24/2021)   Social Connection and Isolation Panel [NHANES]    Frequency of Communication with Friends and Family: More than three times a week    Frequency of Social Gatherings with Friends and Family: More than three times a week    Attends Religious Services: More than 4 times per year    Active Member of Clubs or Organizations: Yes    Attends Banker Meetings: More than 4 times per year    Marital Status: Divorced   Vitals:   07/31/22 0750  BP: 122/70  Pulse: 60  Resp: 16  Temp: 98.2 F (36.8 C)  SpO2: 99%   Body mass index is 27.31 kg/m.  Physical Exam Vitals and nursing note reviewed.  Constitutional:      General: She is not in acute distress.    Appearance: She is well-developed.  HENT:     Head: Normocephalic and atraumatic.     Mouth/Throat:     Mouth: Mucous membranes are moist.     Pharynx: Oropharynx is clear.  Eyes:     Conjunctiva/sclera: Conjunctivae normal.  Cardiovascular:     Rate and Rhythm: Normal rate and regular rhythm.     Pulses:          Dorsalis pedis pulses are 2+ on the right side and 2+ on the left side.      Heart sounds: No murmur heard. Pulmonary:     Effort: Pulmonary effort is normal. No respiratory distress.     Breath sounds: Normal breath sounds.  Abdominal:     Palpations: Abdomen is soft. There is no hepatomegaly or mass.     Tenderness: There is no abdominal tenderness.  Musculoskeletal:     Cervical back: Spasms and crepitus present. No edema or erythema. No pain with movement. Decreased range of motion.  Lymphadenopathy:     Cervical: No cervical adenopathy.  Skin:    General: Skin is warm.     Findings: No erythema or rash.  Neurological:  General: No focal deficit present.     Mental Status: She is alert and oriented to person, place, and time.     Cranial Nerves: No cranial nerve deficit.     Gait: Gait normal.  Psychiatric:     Comments: Well groomed, good eye contact.   ASSESSMENT AND PLAN: Essential hypertension Assessment & Plan: 153/80. We discussed possible complications of elevated BP. For now no changes in atenolol-chlorthalidone, we discussed some side effects. Instructed to monitor BP at home, proper technique discussed. She will let me know about BP readings in 2 weeks. Continue low-salt diet. She is due for an eye exam.  Orders: -     Comprehensive metabolic panel; Future -     CBC; Future  Atherosclerosis of aorta [facility]) Assessment & Plan: Seen on renal CT in 08/2018. She discontinued atorvastatin about a year ago. We discussed CV benefits of statin medications, recommend resuming it.  Orders: -     Comprehensive metabolic panel; Future -     Lipid panel; Future  Prediabetes Assessment & Plan: We discussed diagnostic criteria for DM 2. Encouraged a healthy lifestyle for diabetes prevention. Further recommendation will be given according to hemoglobin A1c result.  Orders: -     Hemoglobin A1c; Future  Hyperlipidemia, mixed Assessment & Plan: Last LDL 229 in 11/2020. We discussed possible complications of hyperlipidemia as well as  benefits of statin medications. She discontinue atorvastatin about a year ago, recommend resuming medication. Further recommendation will be given according to lipid panel results.   Neck pain -     DG Cervical Spine Complete; Future -     Ambulatory referral to Physical Therapy -     tiZANidine HCl; Take 0.5-1 tablets (2-4 mg total) by mouth every 8 (eight) hours as needed for muscle spasms.  Dispense: 30 tablet; Refill: 1  Asymptomatic postmenopausal estrogen deficiency -     DG Bone Density; Future   Offered Prevnar 20 but declines it at this time. She is also due for colon cancer screening and will arrange a colonoscopy appointment. She had a mammogram in April/2024 and has not had DEXA. She has a history of a toe issue that was considered for surgery but has not been addressed due to personal circumstances.*** Return in about 5 months (around 12/31/2022).  Camdon Saetern G. [place], MD  [facility]. [place] office.

## 2022-07-31 ENCOUNTER — Encounter: Payer: Self-pay | Admitting: Family Medicine

## 2022-07-31 ENCOUNTER — Ambulatory Visit (INDEPENDENT_AMBULATORY_CARE_PROVIDER_SITE_OTHER): Payer: No Typology Code available for payment source | Admitting: Family Medicine

## 2022-07-31 ENCOUNTER — Ambulatory Visit (INDEPENDENT_AMBULATORY_CARE_PROVIDER_SITE_OTHER): Payer: No Typology Code available for payment source

## 2022-07-31 ENCOUNTER — Ambulatory Visit: Payer: No Typology Code available for payment source

## 2022-07-31 VITALS — BP 122/70 | HR 60 | Temp 98.2°F | Resp 16 | Ht 64.0 in | Wt 159.1 lb

## 2022-07-31 DIAGNOSIS — M542 Cervicalgia: Secondary | ICD-10-CM

## 2022-07-31 DIAGNOSIS — M5032 Other cervical disc degeneration, mid-cervical region, unspecified level: Secondary | ICD-10-CM | POA: Diagnosis not present

## 2022-07-31 DIAGNOSIS — Z780 Asymptomatic menopausal state: Secondary | ICD-10-CM

## 2022-07-31 DIAGNOSIS — I10 Essential (primary) hypertension: Secondary | ICD-10-CM

## 2022-07-31 DIAGNOSIS — I700 Atherosclerosis of aorta: Secondary | ICD-10-CM | POA: Diagnosis not present

## 2022-07-31 DIAGNOSIS — Q765 Cervical rib: Secondary | ICD-10-CM | POA: Diagnosis not present

## 2022-07-31 DIAGNOSIS — R7303 Prediabetes: Secondary | ICD-10-CM

## 2022-07-31 DIAGNOSIS — E782 Mixed hyperlipidemia: Secondary | ICD-10-CM

## 2022-07-31 DIAGNOSIS — R079 Chest pain, unspecified: Secondary | ICD-10-CM | POA: Diagnosis not present

## 2022-07-31 LAB — COMPREHENSIVE METABOLIC PANEL
ALT: 16 U/L (ref 0–35)
AST: 14 U/L (ref 0–37)
Albumin: 4.3 g/dL (ref 3.5–5.2)
Alkaline Phosphatase: 100 U/L (ref 39–117)
BUN: 21 mg/dL (ref 6–23)
CO2: 28 mEq/L (ref 19–32)
Calcium: 9.7 mg/dL (ref 8.4–10.5)
Chloride: 104 mEq/L (ref 96–112)
Creatinine, Ser: 0.84 mg/dL (ref 0.40–1.20)
GFR: 71.26 mL/min (ref >60.00)
Glucose, Bld: 119 mg/dL — ABNORMAL HIGH (ref 70–99)
Potassium: 3.9 mEq/L (ref 3.5–5.1)
Sodium: 141 mEq/L (ref 135–145)
Total Bilirubin: 0.4 mg/dL (ref 0.2–1.2)
Total Protein: 7.2 g/dL (ref 6.0–8.3)

## 2022-07-31 LAB — CBC
HCT: 42.5 % (ref 36.0–46.0)
Hemoglobin: 13.5 g/dL (ref 12.0–15.0)
MCHC: 31.8 g/dL (ref 30.0–36.0)
MCV: 77.7 fl — ABNORMAL LOW (ref 78.0–100.0)
Platelets: 236 10*3/uL (ref 150.0–400.0)
RBC: 5.46 Mil/uL — ABNORMAL HIGH (ref 3.87–5.11)
RDW: 14 % (ref 11.5–15.5)
WBC: 6.6 10*3/uL (ref 4.0–10.5)

## 2022-07-31 LAB — LIPID PANEL
Cholesterol: 283 mg/dL — ABNORMAL HIGH (ref 0–200)
HDL: 44.3 mg/dL (ref >39.00)
LDL Cholesterol: 213 mg/dL — ABNORMAL HIGH (ref 0–99)
NonHDL: 238.42
Total CHOL/HDL Ratio: 6
Triglycerides: 127 mg/dL (ref 0.0–149.0)
VLDL: 25.4 mg/dL (ref 0.0–40.0)

## 2022-07-31 LAB — HEMOGLOBIN A1C: Hgb A1c MFr Bld: 6.3 % (ref 4.6–6.5)

## 2022-07-31 MED ORDER — TIZANIDINE HCL 4 MG PO TABS
2.0000 mg | ORAL_TABLET | Freq: Three times a day (TID) | ORAL | 1 refills | Status: DC | PRN
Start: 2022-07-31 — End: 2022-08-26

## 2022-07-31 NOTE — Assessment & Plan Note (Signed)
We discussed diagnostic criteria for DM 2. Encouraged a healthy lifestyle for diabetes prevention. Further recommendation will be given according to hemoglobin A1c result.

## 2022-07-31 NOTE — Assessment & Plan Note (Signed)
Seen on renal CT in 08/2018. She discontinued atorvastatin about a year ago. We discussed CV benefits of statin medications, recommend resuming it.

## 2022-07-31 NOTE — Assessment & Plan Note (Addendum)
153/80. We discussed possible complications of elevated BP. For now no changes in atenolol-chlorthalidone, we discussed some side effects. Instructed to monitor BP at home, proper technique discussed. She will let me know about BP readings in 2 weeks. Continue low-salt diet. She is due for an eye exam.

## 2022-07-31 NOTE — Patient Instructions (Addendum)
A few things to remember from today's visit:  Essential hypertension - Plan: Comprehensive metabolic panel, CBC  Atherosclerosis of aorta (HCC), Chronic - Plan: Comprehensive metabolic panel, Lipid panel  Prediabetes - Plan: Hemoglobin A1c  Hyperlipidemia, mixed  Neck pain - Plan: DG Cervical Spine Complete, Ambulatory referral to Physical Therapy, tiZANidine (ZANAFLEX) 4 MG tablet  Asymptomatic postmenopausal estrogen deficiency - Plan: DG Bone Density Monitor blood pressure at home 3 times in the morning and 2 times at night. No changes in blood pressure medications today. Let me know about readings in 2 weeks. Resume cholesterol medication.  If you need refills for medications you take chronically, please call your pharmacy. Do not use My Chart to request refills or for acute issues that need immediate attention. If you send a my chart message, it may take a few days to be addressed, specially if I am not in the office.  Please be sure medication list is accurate. If a new problem present, please set up appointment sooner than planned today.

## 2022-07-31 NOTE — Assessment & Plan Note (Signed)
Last LDL 229 in 11/2020. We discussed possible complications of hyperlipidemia as well as benefits of statin medications. She discontinue atorvastatin about a year ago, recommend resuming medication. Further recommendation will be given according to lipid panel results.

## 2022-08-01 MED ORDER — ATORVASTATIN CALCIUM 40 MG PO TABS
40.0000 mg | ORAL_TABLET | Freq: Every day | ORAL | 3 refills | Status: AC
Start: 2022-08-01 — End: ?

## 2022-08-13 ENCOUNTER — Encounter (HOSPITAL_COMMUNITY): Payer: Self-pay

## 2022-08-13 ENCOUNTER — Telehealth: Payer: Self-pay | Admitting: Family Medicine

## 2022-08-13 ENCOUNTER — Ambulatory Visit: Admission: EM | Admit: 2022-08-13 | Payer: No Typology Code available for payment source | Source: Home / Self Care

## 2022-08-13 ENCOUNTER — Ambulatory Visit (HOSPITAL_COMMUNITY)
Admission: EM | Admit: 2022-08-13 | Discharge: 2022-08-13 | Disposition: A | Discharge status: Home / Self Care | Payer: No Typology Code available for payment source | Attending: Nurse Practitioner | Admitting: Nurse Practitioner

## 2022-08-13 DIAGNOSIS — R52 Pain, unspecified: Secondary | ICD-10-CM

## 2022-08-13 DIAGNOSIS — R61 Generalized hyperhidrosis: Secondary | ICD-10-CM

## 2022-08-13 DIAGNOSIS — Z1152 Encounter for screening for COVID-19: Secondary | ICD-10-CM | POA: Diagnosis not present

## 2022-08-13 DIAGNOSIS — R739 Hyperglycemia, unspecified: Secondary | ICD-10-CM | POA: Diagnosis not present

## 2022-08-13 DIAGNOSIS — R531 Weakness: Secondary | ICD-10-CM | POA: Diagnosis not present

## 2022-08-13 DIAGNOSIS — M542 Cervicalgia: Secondary | ICD-10-CM | POA: Diagnosis not present

## 2022-08-13 DIAGNOSIS — I959 Hypotension, unspecified: Secondary | ICD-10-CM | POA: Diagnosis not present

## 2022-08-13 NOTE — Discharge Instructions (Signed)
You may have a viral upper respiratory infection.  If so, symptoms should improve over the next week to 10 days.  If you develop chest pain or shortness of breath, go to the emergency room.  We have tested you today for COVID-19.  You will see the results in Mychart and we will contact you with positive results.  Please stay home as long as you have fever and until it has been 24 hours without a fever and without fever reducing medication.    Some things that can make you feel better are: - Increased rest - Increasing fluid with water/sugar free electrolytes - Acetaminophen and ibuprofen as needed for fever/pain - Salt water gargling, chloraseptic spray and throat lozenges - OTC guaifenesin (Mucinex) 600 mg twice daily for congestion - Saline sinus flushes or a neti pot - Humidifying the air

## 2022-08-13 NOTE — ED Triage Notes (Signed)
Pt reports she has been feeling sluggish and sweat x 3 days
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
## 2022-08-13 NOTE — Telephone Encounter (Signed)
Pt does not have access to mychart and would like chest xray and cervical spine xray results

## 2022-08-14 ENCOUNTER — Encounter (HOSPITAL_COMMUNITY): Payer: Self-pay

## 2022-08-14 ENCOUNTER — Other Ambulatory Visit: Payer: Self-pay

## 2022-08-14 ENCOUNTER — Emergency Department (HOSPITAL_COMMUNITY)
Admission: EM | Admit: 2022-08-14 | Discharge: 2022-08-14 | Disposition: A | Discharge status: Home / Self Care | Payer: No Typology Code available for payment source | Attending: Emergency Medicine | Admitting: Emergency Medicine

## 2022-08-14 DIAGNOSIS — T50905A Adverse effect of unspecified drugs, medicaments and biological substances, initial encounter: Secondary | ICD-10-CM

## 2022-08-14 DIAGNOSIS — R5383 Other fatigue: Secondary | ICD-10-CM | POA: Insufficient documentation

## 2022-08-14 DIAGNOSIS — R400 Somnolence: Secondary | ICD-10-CM | POA: Diagnosis not present

## 2022-08-14 DIAGNOSIS — Z79899 Other long term (current) drug therapy: Secondary | ICD-10-CM | POA: Diagnosis not present

## 2022-08-14 DIAGNOSIS — I10 Essential (primary) hypertension: Secondary | ICD-10-CM | POA: Insufficient documentation

## 2022-08-14 LAB — CBC WITH DIFFERENTIAL/PLATELET
Abs Immature Granulocytes: 0.01 10*3/uL (ref 0.00–0.07)
Basophils Absolute: 0 10*3/uL (ref 0.0–0.1)
Basophils Relative: 0 %
Eosinophils Absolute: 0.1 10*3/uL (ref 0.0–0.5)
Eosinophils Relative: 1 %
HCT: 38.5 % (ref 36.0–46.0)
Hemoglobin: 12.4 g/dL (ref 12.0–15.0)
Immature Granulocytes: 0 %
Lymphocytes Relative: 14 %
Lymphs Abs: 1 10*3/uL (ref 0.7–4.0)
MCH: 25.2 pg — ABNORMAL LOW (ref 26.0–34.0)
MCHC: 32.2 g/dL (ref 30.0–36.0)
MCV: 78.3 fL — ABNORMAL LOW (ref 80.0–100.0)
Monocytes Absolute: 0.3 10*3/uL (ref 0.1–1.0)
Monocytes Relative: 4 %
Neutro Abs: 5.8 10*3/uL (ref 1.7–7.7)
Neutrophils Relative %: 81 %
Platelets: 182 10*3/uL (ref 150–400)
RBC: 4.92 MIL/uL (ref 3.87–5.11)
RDW: 13 % (ref 11.5–15.5)
WBC: 7.3 10*3/uL (ref 4.0–10.5)
nRBC: 0 % (ref 0.0–0.2)

## 2022-08-14 LAB — COMPREHENSIVE METABOLIC PANEL
ALT: 20 U/L (ref 0–44)
AST: 17 U/L (ref 15–41)
Albumin: 3.7 g/dL (ref 3.5–5.0)
Alkaline Phosphatase: 80 U/L (ref 38–126)
Anion gap: 9 (ref 5–15)
BUN: 24 mg/dL — ABNORMAL HIGH (ref 8–23)
CO2: 25 mmol/L (ref 22–32)
Calcium: 8.6 mg/dL — ABNORMAL LOW (ref 8.9–10.3)
Chloride: 99 mmol/L (ref 98–111)
Creatinine, Ser: 0.9 mg/dL (ref 0.44–1.00)
GFR, Estimated: >60 mL/min (ref >60)
Glucose, Bld: 202 mg/dL — ABNORMAL HIGH (ref 70–99)
Potassium: 2.8 mmol/L — ABNORMAL LOW (ref 3.5–5.1)
Sodium: 133 mmol/L — ABNORMAL LOW (ref 135–145)
Total Bilirubin: 0.5 mg/dL (ref 0.3–1.2)
Total Protein: 7 g/dL (ref 6.5–8.1)

## 2022-08-14 LAB — CBG MONITORING, ED: Glucose-Capillary: 201 mg/dL — ABNORMAL HIGH (ref 70–99)

## 2022-08-14 MED ORDER — POTASSIUM CHLORIDE 10 MEQ/100ML IV SOLN
10.0000 meq | Freq: Once | INTRAVENOUS | Status: AC
  Administered 2022-08-14: 10 meq via INTRAVENOUS
  Filled 2022-08-14: qty 100

## 2022-08-14 MED ORDER — POTASSIUM CHLORIDE 20 MEQ PO PACK
40.0000 meq | PACK | Freq: Once | ORAL | Status: AC
  Administered 2022-08-14: 40 meq via ORAL
  Filled 2022-08-14: qty 2

## 2022-08-14 MED ORDER — SODIUM CHLORIDE 0.9 % IV BOLUS
1000.0000 mL | Freq: Once | INTRAVENOUS | Status: AC
  Administered 2022-08-14: 1000 mL via INTRAVENOUS

## 2022-08-14 MED ORDER — POTASSIUM CHLORIDE CRYS ER 20 MEQ PO TBCR: 20.0000 meq | EXTENDED_RELEASE_TABLET | Freq: Two times a day (BID) | ORAL | 0 refills | Status: DC

## 2022-08-14 NOTE — ED Triage Notes (Signed)
Pt was seen at [facility] today for neck pain and prescribed tizanidine 4mg . Pt went home and took 4mg  of medication and then smoked marijuana. After an hour pt became unsteady on feet and somnolent. EMS arrived and pt had to be physically stimulated to respond. During triage pt falling asleep.

## 2022-08-14 NOTE — ED Notes (Signed)
Pt more alert at this time and was able to ambulate independently to bathroom

## 2022-08-14 NOTE — Telephone Encounter (Signed)
Spoke to pt and went over x-ray result from Dr. [place].   Pt reports she had went to ER last night after taking1 tablet of muscle relaxer. Pt states she was dizzy and couldn't walk. Call 911. They came and give her an IV.   Pt upsets and said no one told her about the instruction, the pharmacy didn't tell either. She said the instruction wasn't clear to her. She said she didn't know she was suppose to take 1/2 of tablet to 1 tablet.   Pt updates the provider at ER gave her potassium to take due potassium was low and she has not pick it up yet. States she will pick it up today.   Schedule a ED f/u appt with Dr. [place] on 08/26/2022.   Sending to Dr. [place] for her info.

## 2022-08-14 NOTE — Discharge Instructions (Signed)
Thank you for allowing [place] to be a part of your care today.  You were evaluated in the ED for side effects of medication.  Your labs showed that you had an elevated blood sugar and low potassium.  I have sent over a few days of potassium to help replenish this.  Please take as prescribed.  I recommend following up with your primary care doctor in 1 week to recheck labs.  Please discontinue taking the tizanidine as it cause you to become very somnolent and weak.  I also recommend asking your PCP about your neck pain and more advanced imaging.  Return to the ED if develop sudden worsening of your symptoms or if you have any new concerns.

## 2022-08-14 NOTE — ED Provider Notes (Signed)
[place] EMERGENCY DEPARTMENT AT [facility] Provider Note   CSN: [id number] Arrival date & time: 08/14/22  0002     History  Chief Complaint  Patient presents with   Fatigue    Alexis Stout is a 67 y.o. female with past medical history significant for hypertension, hyperlipidemia, GERD, arthritis presents to the ED for somnolence and generalized weakness.  Patient was seen at urgent care for neck pain and prescribed tizanidine 4 mg.  Patient states she took a whole tablet of medication and then smoked a "small joint of marijuana".  After approximately an hour, patient became unsteady on her feet and felt weak.  She states then she had difficulty staying awake.  When EMS arrived, patient had to be physically stimulated to respond and answer questions.  Denies dizziness, lightheadedness, syncope, numbness, nausea, vomiting.     Home Medications Prior to Admission medications   Medication Sig Start Date End Date Taking? Authorizing Provider  potassium chloride SA (KLOR-CON M) 20 MEQ tablet Take 1 tablet (20 mEq total) by mouth 2 (two) times daily. 08/14/22  Yes Kaytlynne Neace R, PA-C  atenolol-chlorthalidone (TENORETIC) 50-25 MG tablet Take 1 tablet by mouth daily. Needs follow up. 07/27/22   [place], Betty G, MD  atorvastatin (LIPITOR) 40 MG tablet Take 1 tablet (40 mg total) by mouth daily. 08/01/22   [place], Betty G, MD  tiZANidine (ZANAFLEX) 4 MG tablet Take 0.5-1 tablets (2-4 mg total) by mouth every 8 (eight) hours as needed for muscle spasms. 07/31/22 08/30/22  [place], Betty G, MD      Allergies    Patient has no known allergies.    Review of Systems   Review of Systems  Gastrointestinal:  Negative for nausea and vomiting.  Neurological:  Positive for weakness (Generalized). Negative for dizziness, syncope, light-headedness and numbness.    Physical Exam Updated Vital Signs BP 120/68   Pulse (!) 53   Temp 97.7 F (36.5 C)   Resp 19   Ht 5\' 4"  (1.626 m)   Wt  74.8 kg   LMP  (LMP Unknown)   SpO2 96%   BMI 28.32 kg/m  Physical Exam Vitals and nursing note reviewed.  Constitutional:      General: She is sleeping. She is not in acute distress.    Appearance: Normal appearance. She is not ill-appearing or diaphoretic.     Comments: Patient is somnolent, but is able to wake and answer questions.  Patient falls back asleep during initial assessment.   Cardiovascular:     Rate and Rhythm: Regular rhythm. Bradycardia present.     Heart sounds: Normal heart sounds.  Pulmonary:     Effort: Pulmonary effort is normal.     Breath sounds: Normal breath sounds and air entry.  Abdominal:     General: Abdomen is flat.     Palpations: Abdomen is soft.     Tenderness: There is no abdominal tenderness.  Skin:    General: Skin is warm and dry.     Capillary Refill: Capillary refill takes less than 2 seconds.  Neurological:     Mental Status: She is oriented to person, place, and time.     GCS: GCS eye subscore is 4. GCS verbal subscore is 5. GCS motor subscore is 6.  Psychiatric:        Mood and Affect: Mood normal.        Behavior: Behavior normal.     ED Results / Procedures / Treatments  Labs (all labs ordered are listed, but only abnormal results are displayed) Labs Reviewed  CBC WITH DIFFERENTIAL/PLATELET - Abnormal; Notable for the following components:      Result Value   MCV 78.3 (*)    MCH 25.2 (*)    All other components within normal limits  COMPREHENSIVE METABOLIC PANEL - Abnormal; Notable for the following components:   Sodium 133 (*)    Potassium 2.8 (*)    Glucose, Bld 202 (*)    BUN 24 (*)    Calcium 8.6 (*)    All other components within normal limits  CBG MONITORING, ED - Abnormal; Notable for the following components:   Glucose-Capillary 201 (*)    All other components within normal limits    EKG None  Radiology No results found.  Procedures Procedures    Medications Ordered in ED Medications  sodium  chloride 0.9 % bolus 1,000 mL (1,000 mLs Intravenous New Bag/Given 08/14/22 0217)  potassium chloride 10 mEq in 100 mL IVPB (0 mEq Intravenous Stopped 08/14/22 0348)  potassium chloride (KLOR-CON) packet 40 mEq (40 mEq Oral Given 08/14/22 0246)    ED Course/ Medical Decision Making/ A&P                                 Medical Decision Making Amount and/or Complexity of Data Reviewed Labs: ordered.  Risk Prescription drug management.   This patient presents to the ED with chief complaint(s) of somnolence, generalized weakness with pertinent past medical history of hypertension, hyperlipidemia.  The complaint involves an extensive differential diagnosis and also carries with it a high risk of complications and morbidity.    The differential diagnosis includes adverse effect of drug, metabolic derangement, electrolyte disturbance, hypoglycemia, hyperglycemia   The initial plan is to obtain labs, EKG  Additional history obtained: Additional history obtained from EMS  reports that she had a CBG around 300.  They also had to physically stimulate the patient to get her to wake up.  Initial Assessment:   On exam, patient is somnolent, is able to wake up and answer questions with stimulation.  Patient does fall back asleep during initial assessment.  When patient is awake, she is able to answer questions appropriately.  Speech is not slurred.  Vital signs are stable.  Heart rate is in the 50s with regular rhythm.  Lungs are clear to auscultation bilaterally.  Difficult to complete full assessment due to patient's somnolence.  Independent ECG/labs interpretation:  The following labs were independently interpreted:  CBC without leukocytosis or anemia.  Metabolic panel with hypokalemia, hyponatremia, hypocalcemia and hyperglycemia.   Treatment and Reassessment: Patient given IV fluids and both oral and IV potassium for replenishment.  Patient was allowed to metabolize the tizanidine.  Upon  reassessment, patient is alert, awake, and answering all questions appropriately.  Patient has been seen ambulating in the ED without difficulty.  She is able to recall all events surrounding tonight and what happened with her medication.  Patient states she has never taken muscle relaxants before.  Neuroexam is unremarkable.  Coordination is intact.  Patient is moving all extremities appropriately.  5/5 strength in upper and lower extremities.  Disposition:   Patient metabolize medication and is now awake and alert which is reassuring.  Advised patient to discontinue using the tizanidine.  Will send patient home on a short course of potassium for replenishment.  Recommended she follow-up with her primary care in  1 week to recheck labs.  The patient has been appropriately medically screened and/or stabilized in the ED. I have low suspicion for any other emergent medical condition which would require further screening, evaluation or treatment in the ED or require inpatient management. At time of discharge the patient is hemodynamically stable and in no acute distress. I have discussed work-up results and diagnosis with patient and answered all questions. Patient is agreeable with discharge plan. We discussed strict return precautions for returning to the emergency department and they verbalized understanding.             Final Clinical Impression(s) / ED Diagnoses Final diagnoses:  Somnolence  Adverse effect of drug, initial encounter    Rx / DC Orders ED Discharge Orders          Ordered    potassium chloride SA (KLOR-CON M) 20 MEQ tablet  2 times daily        08/14/22 0633              Lenard Simmer, PA-C 08/14/22 5284    Tilden Fossa, MD 08/15/22 [phone]

## 2022-08-21 ENCOUNTER — Other Ambulatory Visit: Payer: Self-pay

## 2022-08-21 DIAGNOSIS — I10 Essential (primary) hypertension: Secondary | ICD-10-CM

## 2022-08-21 MED ORDER — ATENOLOL-CHLORTHALIDONE 50-25 MG PO TABS: 1.0000 | ORAL_TABLET | Freq: Every day | ORAL | 3 refills | Status: DC

## 2022-08-25 NOTE — Progress Notes (Unsigned)
HPI: [name] is a 67 y.o. female, who is here today to follow on recent ED visit. She was last seen here in the office on 07/31/2022. Evaluated in the ED on 08/13/2022 when she presented to the ED with somnolence. States that she has not been checking her blood pressure at home recently but reports SBP's 160-170 since stopping Atenolol-Chlorthalidone ***when she has checked it.  She was under the impression that her atenolol medication had been replaced, but it was clarified that she has been on atenolol chlorthalidone, 50/25 mg daily since 2021. She has not been taking any blood pressure medication since her ED visit due to confusion about her medications.  She reports stopping the muscle relaxant Soma due to excessive drowsiness. She has taken muscle relaxants before, such as Baclofen, without any adverse reactions. Her neck pain persists, and she has not yet heard from physical therapy.  She experienced palpitations while taking potassium supplements, which have since been discontinued. Her somnolence has resolved after stopping the muscle relaxant, but her neck pain remains chronic.  Lab Results  Component Value Date   NA 133 (L) 08/14/2022   CL 99 08/14/2022   K 2.8 (L) 08/14/2022   CO2 25 08/14/2022   BUN 24 (H) 08/14/2022   CREATININE 0.90 08/14/2022   GFRNONAA >60 08/14/2022   CALCIUM 8.6 (L) 08/14/2022   ALBUMIN 3.7 08/14/2022   GLUCOSE 202 (H) 08/14/2022   Lab Results  Component Value Date   HGBA1C 6.3 07/31/2022   Review of Systems  Constitutional:  Negative for appetite change, chills and fever.  Respiratory:  Negative for cough and wheezing.   Gastrointestinal:  Negative for abdominal pain, nausea and vomiting.  Genitourinary:  Negative for decreased urine volume, dysuria and hematuria.  Musculoskeletal:  Positive for neck pain.  See other pertinent positives and negatives in HPI.  Current Outpatient Medications on File Prior to Visit  Medication Sig  Dispense Refill   atorvastatin (LIPITOR) 40 MG tablet Take 1 tablet (40 mg total) by mouth daily. 90 tablet 3   potassium chloride SA (KLOR-CON M) 20 MEQ tablet Take 1 tablet (20 mEq total) by mouth 2 (two) times daily. 10 tablet 0   No current facility-administered medications on file prior to visit.   Past Medical History:  Diagnosis Date   Arthritis    GERD (gastroesophageal reflux disease)    Hyperlipidemia    Hypertension    No Known Allergies  Social History   Socioeconomic History   Marital status: Divorced    Spouse name: Not on file   Number of children: 1   Years of education: Not on file   Highest education level: Not on file  Occupational History   Occupation: property mgmt  Tobacco Use   Smoking status: Never   Smokeless tobacco: Never  Vaping Use   Vaping status: Never Used  Substance and Sexual Activity   Alcohol use: Yes    Alcohol/week: 3.0 standard drinks of alcohol    Types: 3 Glasses of wine per week   Drug use: Never   Sexual activity: Not on file    Comment: socially  Other Topics Concern   Not on file  Social History Narrative   Not on file   Social Determinants of Health   Financial Resource Strain: Low Risk  (04/24/2021)   Overall Financial Resource Strain (CARDIA)    Difficulty of Paying Living Expenses: Not hard at all  Food Insecurity: No Food Insecurity (04/24/2021)  Hunger Vital Sign    Worried About Running Out of Food in the Last Year: Never true    Ran Out of Food in the Last Year: Never true  Transportation Needs: No Transportation Needs (04/24/2021)   PRAPARE - Administrator, Civil Service (Medical): No    Lack of Transportation (Non-Medical): No  Physical Activity: Insufficiently Active (04/24/2021)   Exercise Vital Sign    Days of Exercise per Week: 1 day    Minutes of Exercise per Session: 30 min  Stress: No Stress Concern Present (04/24/2021)   [employer] of Occupational Health - Occupational Stress  Questionnaire    Feeling of Stress : Not at all  Social Connections: Moderately Integrated (04/24/2021)   Social Connection and Isolation Panel [NHANES]    Frequency of Communication with Friends and Family: More than three times a week    Frequency of Social Gatherings with Friends and Family: More than three times a week    Attends Religious Services: More than 4 times per year    Active Member of Clubs or Organizations: Yes    Attends Banker Meetings: More than 4 times per year    Marital Status: Divorced   Vitals:   08/26/22 0839  BP: (!) 180/82  Pulse: 67  Resp: 16  Temp: 98.5 F (36.9 C)  SpO2: 97%   Body mass index is 25.87 kg/m.  Physical Exam Vitals and nursing note reviewed.  Constitutional:      General: She is not in acute distress.    Appearance: She is well-developed.  HENT:     Head: Normocephalic and atraumatic.     Mouth/Throat:     Mouth: Mucous membranes are moist.     Pharynx: Oropharynx is clear.  Eyes:     Conjunctiva/sclera: Conjunctivae normal.  Cardiovascular:     Rate and Rhythm: Normal rate and regular rhythm.     Pulses:          Dorsalis pedis pulses are 2+ on the right side and 2+ on the left side.     Heart sounds: No murmur heard. Pulmonary:     Effort: Pulmonary effort is normal. No respiratory distress.     Breath sounds: Normal breath sounds.  Abdominal:     Palpations: Abdomen is soft. There is no hepatomegaly or mass.     Tenderness: There is no abdominal tenderness.  Lymphadenopathy:     Cervical: No cervical adenopathy.  Skin:    General: Skin is warm.     Findings: No erythema or rash.  Neurological:     General: No focal deficit present.     Mental Status: She is alert and oriented to person, place, and time.     Cranial Nerves: No cranial nerve deficit.     Gait: Gait normal.  Psychiatric:     Comments: Well groomed, good eye contact.    ASSESSMENT AND PLAN:  Alexis "Wynona Canes" was seen today for  medical management of chronic issues.  Diagnoses and all orders for this visit: Lab Results  Component Value Date   NA 140 08/26/2022   CL 107 08/26/2022   K 4.1 08/26/2022   CO2 27 08/26/2022   BUN 11 08/26/2022   CREATININE 0.68 08/26/2022   GFR 89.26 08/26/2022   CALCIUM 9.3 08/26/2022   ALBUMIN 3.7 08/14/2022   GLUCOSE 124 (H) 08/26/2022    Hypokalemia -     Basic metabolic panel; Future -     Magnesium; Future -  Aldosterone + renin activity w/ ratio; Future  Neck pain Assessment & Plan: This is a chronic problem, PT has been trying to contact her, contact information provided so she can call and arrange appointment. She is not interested in trying a different muscle relaxant.   Essential hypertension Assessment & Plan: BP is not well controlled. Possible complications of elevated BP discussed. Changes today:Resume Atenolol-Chlorthalidone 50-25 mg daily. Low salt/DASH diet. Monitor BP at home morning and night. Instructed about warning signs. BP readings in 3 weeks.  Orders: -     Basic metabolic panel; Future -     Magnesium; Future -     Atenolol-Chlorthalidone; Take 1 tablet by mouth daily.  Dispense: 90 tablet; Refill: 2 -     Aldosterone + renin activity w/ ratio; Future  Return if symptoms worsen or fail to improve, for keep next appointment.  Ranveer Wahlstrom G. [place], MD  [facility]. [facility].

## 2022-08-26 ENCOUNTER — Encounter: Payer: Self-pay | Admitting: Family Medicine

## 2022-08-26 ENCOUNTER — Ambulatory Visit (INDEPENDENT_AMBULATORY_CARE_PROVIDER_SITE_OTHER): Payer: No Typology Code available for payment source | Admitting: Family Medicine

## 2022-08-26 VITALS — BP 180/82 | HR 67 | Temp 98.5°F | Resp 16 | Ht 64.8 in | Wt 154.5 lb

## 2022-08-26 DIAGNOSIS — I10 Essential (primary) hypertension: Secondary | ICD-10-CM | POA: Diagnosis not present

## 2022-08-26 DIAGNOSIS — M542 Cervicalgia: Secondary | ICD-10-CM | POA: Insufficient documentation

## 2022-08-26 DIAGNOSIS — E876 Hypokalemia: Secondary | ICD-10-CM | POA: Diagnosis not present

## 2022-08-26 LAB — BASIC METABOLIC PANEL
BUN: 11 mg/dL (ref 6–23)
CO2: 27 mEq/L (ref 19–32)
Calcium: 9.3 mg/dL (ref 8.4–10.5)
Chloride: 107 mEq/L (ref 96–112)
Creatinine, Ser: 0.68 mg/dL (ref 0.40–1.20)
GFR: 89.26 mL/min (ref >60.00)
Glucose, Bld: 124 mg/dL — ABNORMAL HIGH (ref 70–99)
Potassium: 4.1 mEq/L (ref 3.5–5.1)
Sodium: 140 mEq/L (ref 135–145)

## 2022-08-26 LAB — MAGNESIUM: Magnesium: 2.2 mg/dL (ref 1.5–2.5)

## 2022-08-26 MED ORDER — ATENOLOL-CHLORTHALIDONE 50-25 MG PO TABS
1.0000 | ORAL_TABLET | Freq: Every day | ORAL | 2 refills | Status: DC
Start: 2022-08-26 — End: 2023-11-05

## 2022-08-26 NOTE — Assessment & Plan Note (Signed)
This is a chronic problem, PT has been trying to contact her, contact information provided so she can call and arrange appointment. She is not interested in trying a different muscle relaxant.

## 2022-08-26 NOTE — Patient Instructions (Addendum)
A few things to remember from today's visit:  Hypokalemia - Plan: Basic metabolic panel, Magnesium, Aldosterone + renin activity w/ ratio, CANCELED: Aldosterone + renin activity w/ ratio  Essential hypertension - Plan: Basic metabolic panel, Magnesium, atenolol-chlorthalidone (TENORETIC) 50-25 MG tablet, Aldosterone + renin activity w/ ratio, CANCELED: Aldosterone + renin activity w/ ratio  Neck pain  This is the message from PT, please call to arrange appt. "Hi Alexis Stout I am contacting you because your voicemail is full. I was unable to leave a message. When you have a moment, please contact our office regarding a referral for therapy from Dr. Krystena Reitter [place]. When you have a chance please call [phone].   Thank you, [facility]"  Resume blood pressure medication. Goal blood pressure at least under 140/90, ideally 130/80 or less.  If you need refills for medications you take chronically, please call your pharmacy. Do not use My Chart to request refills or for acute issues that need immediate attention. If you send a my chart message, it may take a few days to be addressed, specially if I am not in the office.  Please be sure medication list is accurate. If a new problem present, please set up appointment sooner than planned today.

## 2022-08-26 NOTE — Assessment & Plan Note (Addendum)
BP is not well controlled. Possible complications of elevated BP discussed. Changes today:Resume Atenolol-Chlorthalidone 50-25 mg daily. Low salt/DASH diet. Monitor BP at home morning and night. Instructed about warning signs. BP readings in 3 weeks.

## 2022-09-04 LAB — ALDOSTERONE + RENIN ACTIVITY W/ RATIO
ALDO / PRA Ratio: 21.9 ratio (ref 0.9–28.9)
Aldosterone: 7 ng/dL
Renin Activity: 0.32 ng/mL/h (ref 0.25–5.82)

## 2022-09-23 ENCOUNTER — Encounter: Payer: Self-pay | Admitting: Physical Therapy

## 2022-09-23 ENCOUNTER — Ambulatory Visit: Payer: No Typology Code available for payment source | Attending: Family Medicine | Admitting: Physical Therapy

## 2022-09-23 ENCOUNTER — Other Ambulatory Visit: Payer: Self-pay

## 2022-09-23 DIAGNOSIS — M6281 Muscle weakness (generalized): Secondary | ICD-10-CM | POA: Diagnosis not present

## 2022-09-23 DIAGNOSIS — R293 Abnormal posture: Secondary | ICD-10-CM

## 2022-09-23 DIAGNOSIS — M542 Cervicalgia: Secondary | ICD-10-CM
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
## 2022-09-28 ENCOUNTER — Emergency Department (HOSPITAL_COMMUNITY): Payer: No Typology Code available for payment source

## 2022-09-28 ENCOUNTER — Telehealth: Payer: Self-pay | Admitting: Physical Therapy

## 2022-09-28 ENCOUNTER — Emergency Department (HOSPITAL_COMMUNITY)
Admission: EM | Admit: 2022-09-28 | Discharge: 2022-09-28 | Disposition: A | Discharge status: Home / Self Care | Payer: No Typology Code available for payment source | Attending: Emergency Medicine | Admitting: Emergency Medicine

## 2022-09-28 ENCOUNTER — Encounter (HOSPITAL_COMMUNITY): Payer: Self-pay

## 2022-09-28 ENCOUNTER — Ambulatory Visit: Payer: No Typology Code available for payment source | Admitting: Physician Assistant

## 2022-09-28 ENCOUNTER — Ambulatory Visit: Payer: No Typology Code available for payment source | Admitting: Physical Therapy

## 2022-09-28 ENCOUNTER — Other Ambulatory Visit: Payer: Self-pay

## 2022-09-28 ENCOUNTER — Telehealth: Payer: Self-pay | Admitting: Family Medicine

## 2022-09-28 DIAGNOSIS — S39012A Strain of muscle, fascia and tendon of lower back, initial encounter: Secondary | ICD-10-CM | POA: Insufficient documentation

## 2022-09-28 DIAGNOSIS — S161XXA Strain of muscle, fascia and tendon at neck level, initial encounter: Secondary | ICD-10-CM | POA: Insufficient documentation

## 2022-09-28 DIAGNOSIS — Z041 Encounter for examination and observation following transport accident: Secondary | ICD-10-CM | POA: Diagnosis not present

## 2022-09-28 DIAGNOSIS — Y92410 Unspecified street and highway as the place of occurrence of the external cause: Secondary | ICD-10-CM | POA: Insufficient documentation

## 2022-09-28 DIAGNOSIS — M47812 Spondylosis without myelopathy or radiculopathy, cervical region: Secondary | ICD-10-CM | POA: Diagnosis not present

## 2022-09-28 DIAGNOSIS — S199XXA Unspecified injury of neck, initial encounter: Secondary | ICD-10-CM | POA: Diagnosis present

## 2022-09-28 DIAGNOSIS — M4802 Spinal stenosis, cervical region: Secondary | ICD-10-CM | POA: Diagnosis not present

## 2022-09-28 DIAGNOSIS — I700 Atherosclerosis of aorta: Secondary | ICD-10-CM | POA: Diagnosis not present

## 2022-09-28 DIAGNOSIS — M47816 Spondylosis without myelopathy or radiculopathy, lumbar region: Secondary | ICD-10-CM | POA: Diagnosis not present

## 2022-09-28 MED ORDER — MELOXICAM 7.5 MG PO TABS: 7.5000 mg | ORAL_TABLET | Freq: Every day | ORAL | 0 refills | Status: DC
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
## 2022-09-28 NOTE — Discharge Instructions (Signed)
Tylenol as needed as directed for body aches. Meloxicam daily as needed for pain not controlled with Tylenol. Do not take any other NSAIDs while taking Meloxicam.  Warm compresses for 20 minutes, follow with gentle stretching. Follow up with your PCP for recheck, return to the ER for worsening or concerning symptoms.

## 2022-09-28 NOTE — Telephone Encounter (Signed)
Patient no showed for this appointment at 8:00am. She did not answer and I was unable to leave a voicemail.  Claude Manges, PT 09/28/22 8:23 AM
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
## 2022-09-28 NOTE — Telephone Encounter (Signed)
Noted  

## 2022-09-28 NOTE — Telephone Encounter (Signed)
Pt has appt with Samantha at 3:20 today.

## 2022-09-30 ENCOUNTER — Encounter: Payer: Self-pay | Admitting: Physical Therapy

## 2022-09-30 ENCOUNTER — Ambulatory Visit: Payer: No Typology Code available for payment source | Admitting: Physical Therapy

## 2022-09-30 DIAGNOSIS — M542 Cervicalgia: Secondary | ICD-10-CM | POA: Diagnosis not present

## 2022-09-30 DIAGNOSIS — M6281 Muscle weakness (generalized): Secondary | ICD-10-CM

## 2022-09-30 DIAGNOSIS — R293 Abnormal posture: Secondary | ICD-10-CM

## 2022-09-30 NOTE — Patient Instructions (Signed)

## 2022-10-02 ENCOUNTER — Ambulatory Visit (INDEPENDENT_AMBULATORY_CARE_PROVIDER_SITE_OTHER): Payer: No Typology Code available for payment source | Admitting: Family Medicine

## 2022-10-02 ENCOUNTER — Encounter: Payer: Self-pay | Admitting: Family Medicine

## 2022-10-02 VITALS — BP 122/70 | HR 75 | Resp 16 | Ht 64.0 in | Wt 154.5 lb

## 2022-10-02 DIAGNOSIS — M542 Cervicalgia: Secondary | ICD-10-CM | POA: Diagnosis not present

## 2022-10-02 DIAGNOSIS — I10 Essential (primary) hypertension: Secondary | ICD-10-CM

## 2022-10-02 DIAGNOSIS — M545 Low back pain, unspecified: Secondary | ICD-10-CM | POA: Diagnosis not present

## 2022-10-02 MED ORDER — BACLOFEN 10 MG PO TABS
10.0000 mg | ORAL_TABLET | Freq: Two times a day (BID) | ORAL | 1 refills | Status: AC | PRN
Start: 2022-10-02 — End: ?

## 2022-10-02 NOTE — Patient Instructions (Signed)
A few things to remember from today's visit:  Essential hypertension  Neck pain - Plan: baclofen (LIORESAL) 10 MG tablet  Acute bilateral low back pain, unspecified whether sciatica present - Plan: baclofen (LIORESAL) 10 MG tablet Baclofen 10 mg 1-2 times per day, specially at bedtime. Local massage may help. Keep appt with Dr Vear Clock today.  Continue monitoring blood pressure regularly.   If you need refills for medications you take chronically, please call your pharmacy. Do not use My Chart to request refills or for acute issues that need immediate attention. If you send a my chart message, it may take a few days to be addressed, specially if I am not in the office.  Please be sure medication list is accurate. If a new problem present, please set up appointment sooner than planned today.
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
## 2022-10-03 NOTE — Assessment & Plan Note (Addendum)
BP better controlled. A couple SBP's 140, most 130's and a few 120's. For now continue atenolol-chlorthalidone, 50/25 mg daily and low salt diet. Instructed about warning signs. Side effects of NSAID's discussed. F/U in 6 months.

## 2022-10-05 ENCOUNTER — Telehealth: Payer: Self-pay | Admitting: Physical Therapy

## 2022-10-05 ENCOUNTER — Ambulatory Visit: Payer: No Typology Code available for payment source | Admitting: Physical Therapy
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
## 2022-10-07 ENCOUNTER — Encounter: Payer: No Typology Code available for payment source | Admitting: Physical Therapy

## 2022-10-13 ENCOUNTER — Encounter: Payer: No Typology Code available for payment source | Admitting: Physical Therapy

## 2022-10-15 ENCOUNTER — Encounter: Payer: No Typology Code available for payment source | Admitting: Physical Therapy

## 2022-10-19 ENCOUNTER — Encounter: Payer: No Typology Code available for payment source | Admitting: Physical Therapy

## 2022-10-21 ENCOUNTER — Encounter: Payer: No Typology Code available for payment source | Admitting: Physical Therapy

## 2022-10-26 ENCOUNTER — Encounter: Payer: No Typology Code available for payment source | Admitting: Physical Therapy

## 2022-10-28 ENCOUNTER — Encounter: Payer: No Typology Code available for payment source | Admitting: Physical Therapy

## 2022-10-30 ENCOUNTER — Telehealth: Payer: Self-pay

## 2022-10-30 NOTE — Telephone Encounter (Signed)
Transition Care Management Unsuccessful Follow-up Telephone Call  Date of discharge and from where:  09/28/2022 [facility]  Attempts:  1st Attempt  Reason for unsuccessful TCM follow-up call:  No answer/busy  Lasundra Hascall [facility]  [facility], [facility] Resource Care Guide Direct Dial: [phone]  Website: [email or web address]

## 2022-10-30 NOTE — Telephone Encounter (Signed)
Transition Care Management Follow-up Telephone Call Date of discharge and from where: 09/28/2022 [facility] How have you been since you were released from the hospital? Patient stated her back is beginning to feel better and she is able to get around better and drive. Any questions or concerns? No  Items Reviewed: Did the pt receive and understand the discharge instructions provided? Yes  Medications obtained and verified? Yes  Other? No  Any new allergies since your discharge? No  Dietary orders reviewed? Yes Do you have support at home? Yes   Follow up appointments reviewed:  PCP Hospital f/u appt confirmed? Yes  Scheduled to see Betrty G. [place], MD on 10/02/2022 @ [facility] [place]. Specialist Hospital f/u appt confirmed? Yes  Scheduled to see Claude Manges, PT on 09/30/2022 @ [facility]. Are transportation arrangements needed? No  If their condition worsens, is the pt aware to call PCP or go to the Emergency Dept.? Yes Was the patient provided with contact information for the PCP's office or ED? Yes Was to pt encouraged to call back with questions or concerns? Yes   Sasha Rueth [facility]  [facility], [facility] Guide Direct Dial: [phone]  Website: [email or web address]

## 2022-11-02 ENCOUNTER — Encounter: Payer: No Typology Code available for payment source | Admitting: Physical Therapy

## 2022-11-04 ENCOUNTER — Encounter: Payer: No Typology Code available for payment source | Admitting: Physical Therapy

## 2022-11-09 ENCOUNTER — Encounter: Payer: No Typology Code available for payment source | Admitting: Physical Therapy

## 2022-11-11 ENCOUNTER — Encounter: Payer: No Typology Code available for payment source | Admitting: Physical Therapy

## 2022-11-16 ENCOUNTER — Encounter: Payer: No Typology Code available for payment source | Admitting: Physical Therapy

## 2022-11-18 ENCOUNTER — Ambulatory Visit: Payer: No Typology Code available for payment source | Admitting: Physical Therapy

## 2022-11-23 ENCOUNTER — Encounter: Payer: Self-pay | Admitting: Family Medicine

## 2022-11-23 ENCOUNTER — Ambulatory Visit (INDEPENDENT_AMBULATORY_CARE_PROVIDER_SITE_OTHER): Payer: No Typology Code available for payment source | Admitting: Family Medicine

## 2022-11-23 VITALS — BP 154/80 | HR 71 | Temp 97.9°F | Wt 156.6 lb

## 2022-11-23 DIAGNOSIS — J40 Bronchitis, not specified as acute or chronic: Secondary | ICD-10-CM

## 2022-11-23 MED ORDER — AZITHROMYCIN 250 MG PO TABS: ORAL_TABLET | ORAL | 0 refills | Status: DC

## 2022-11-23 NOTE — Progress Notes (Signed)
   Subjective:    Patient ID: Alexis Stout, female    DOB: [birth year], 67 y.o.   MRN: [id number]  HPI Here for 10 days of a cough that produces yellow sputum. No fever or chest pain or SOB. She got this after she visited her sister who is in the hospital now with pneumonia. Using Vicks VapoRub and drinking fluids    Review of Systems  Constitutional: Negative.   HENT:  Positive for congestion. Negative for ear pain, postnasal drip, sinus pressure and sore throat.   Eyes: Negative.   Respiratory:  Positive for cough. Negative for shortness of breath and wheezing.        Objective:   Physical Exam Constitutional:      Appearance: Normal appearance. She is not ill-appearing.  HENT:     Right Ear: Tympanic membrane, ear canal and external ear normal.     Left Ear: Tympanic membrane, ear canal and external ear normal.     Nose: Nose normal.     Mouth/Throat:     Pharynx: Oropharynx is clear.  Eyes:     Conjunctiva/sclera: Conjunctivae normal.  Pulmonary:     Effort: Pulmonary effort is normal.     Breath sounds: Rhonchi present. No wheezing or rales.  Lymphadenopathy:     Cervical: No cervical adenopathy.  Neurological:     Mental Status: She is alert.           Assessment & Plan:  Bronchitis, treat with a Zpack.  Gershon Crane, MD

## 2022-11-26 ENCOUNTER — Telehealth: Payer: Self-pay | Admitting: Family Medicine

## 2022-11-26 NOTE — Telephone Encounter (Signed)
Trixie // [facility] Called to say they are having difficulty contacting Pt - Re: Medication Adherence for Atorvastatin 40mg  As per Trixie, Pt is past due for refill.  Verified Pt's call back number.  Asking if we can contact Pt and let her know?

## 2022-11-27 ENCOUNTER — Telehealth: Payer: Self-pay | Admitting: Family Medicine

## 2022-11-27 LAB — AMB RESULTS CONSOLE CBG: Glucose: 152

## 2022-11-27 NOTE — Telephone Encounter (Signed)
Would need visit. [name] spoke with pt.

## 2022-11-27 NOTE — Telephone Encounter (Signed)
Pt call and stated she was here on Monday and was given a Zpack but it did not help her cough and want to know will dr. Sw[place]iland call her in something for the cough because she need something now.

## 2022-11-30 ENCOUNTER — Encounter: Payer: Self-pay | Admitting: Family Medicine

## 2022-11-30 ENCOUNTER — Telehealth (INDEPENDENT_AMBULATORY_CARE_PROVIDER_SITE_OTHER): Payer: No Typology Code available for payment source | Admitting: Family Medicine

## 2022-11-30 VITALS — Ht 64.0 in

## 2022-11-30 DIAGNOSIS — R059 Cough, unspecified: Secondary | ICD-10-CM

## 2022-11-30 MED ORDER — HYDROCODONE BIT-HOMATROP MBR 5-1.5 MG/5ML PO SOLN
5.0000 mL | Freq: Two times a day (BID) | ORAL | 0 refills | Status: AC | PRN
Start: 2022-11-30 — End: 2022-12-10

## 2022-11-30 MED ORDER — BENZONATATE 100 MG PO CAPS
200.0000 mg | ORAL_CAPSULE | Freq: Two times a day (BID) | ORAL | 0 refills | Status: AC | PRN
Start: 2022-11-30 — End: 2022-12-10

## 2022-11-30 NOTE — Telephone Encounter (Signed)
Per Maralyn Sago sched pt a visit. Pt will have a mychart virtual visit this afternoon.   FYI.

## 2022-11-30 NOTE — Progress Notes (Signed)
Virtual Visit via Video Note I connected with Alexis Stout on 11/30/2022 by a video enabled telemedicine application and verified that I am speaking with the correct person using two identifiers. Location patient: home Location provider:work office Persons participating in the virtual visit: patient, scribe, provider  I discussed the limitations of evaluation and management by telemedicine and the availability of in person appointments. The patient expressed understanding and agreed to proceed.  Chief Complaint  Patient presents with   Cough   HPI: Alexis Stout is a 67 y.o. female with a PMHx significant for atherosclerosis of aorta, HTN, OA, HLD, and prediabetes who is being seen on video today for persistent cough.   She complains of productive cough for 2 weeks. She endorses having headache, "weakness", wheezing while coughing; all these have since resolved, and cough is not longer interfering with sleep.  She says the cough is worst in the morning, productive.  She denies fever, chills, abnormal weight loss, night sweats, or hemoptysis.   She was seen here on 11/18 by Dr. Clent Ridges for this problem and she was prescribed Azithromycin, which she states did not help with her cough. She states that cough is gradually improving but she is concerned because she has not resolved. Negative for associated CP, wheezing, dyspnea, abdominal pain, nausea, heartburn, vomiting, changes in bowel habits, or skin rash.  She has also been taking OTC Mucinex and cough drops.   ROS: See pertinent positives and negatives per HPI.  Past Medical History:  Diagnosis Date   Arthritis    GERD (gastroesophageal reflux disease)    Hyperlipidemia    Hypertension     Past Surgical History:  Procedure Laterality Date   ABDOMINAL HYSTERECTOMY     COLONOSCOPY  last 10/25/2013   POLYPECTOMY     ROTATOR CUFF REPAIR Right     Family History  Problem Relation Age of Onset   Ovarian cancer Mother     Heart attack Father    Breast cancer Sister 48   Diabetes Sister        x 3   Colon cancer Neg Hx    Esophageal cancer Neg Hx    Rectal cancer Neg Hx    Stomach cancer Neg Hx    Colon polyps Neg Hx     Social History   Socioeconomic History   Marital status: Divorced    Spouse name: Not on file   Number of children: 1   Years of education: Not on file   Highest education level: Not on file  Occupational History   Occupation: property mgmt  Tobacco Use   Smoking status: Never   Smokeless tobacco: Never  Vaping Use   Vaping status: Never Used  Substance and Sexual Activity   Alcohol use: Yes    Alcohol/week: 3.0 standard drinks of alcohol    Types: 3 Glasses of wine per week   Drug use: Never   Sexual activity: Not on file    Comment: socially  Other Topics Concern   Not on file  Social History Narrative   Not on file   Social Determinants of Health   Financial Resource Strain: Low Risk  (04/24/2021)   Overall Financial Resource Strain (CARDIA)    Difficulty of Paying Living Expenses: Not hard at all  Food Insecurity: Patient Declined (11/27/2022)   Hunger Vital Sign    Worried About Running Out of Food in the Last Year: Patient declined    Ran Out of Food in the  Last Year: Patient declined  Transportation Needs: Patient Declined (11/27/2022)   PRAPARE - Administrator, Civil Service (Medical): Patient declined    Lack of Transportation (Non-Medical): Patient declined  Physical Activity: Insufficiently Active (04/24/2021)   Exercise Vital Sign    Days of Exercise per Week: 1 day    Minutes of Exercise per Session: 30 min  Stress: No Stress Concern Present (04/24/2021)   [employer] of Occupational Health - Occupational Stress Questionnaire    Feeling of Stress : Not at all  Social Connections: Moderately Integrated (04/24/2021)   Social Connection and Isolation Panel [NHANES]    Frequency of Communication with Friends and Family: More than  three times a week    Frequency of Social Gatherings with Friends and Family: More than three times a week    Attends Religious Services: More than 4 times per year    Active Member of [employer] or Organizations: Yes    Attends Banker Meetings: More than 4 times per year    Marital Status: Divorced  Intimate Partner Violence: Patient Declined (11/27/2022)   Humiliation, Afraid, Rape, and Kick questionnaire    Fear of Current or Ex-Partner: Patient declined    Emotionally Abused: Patient declined    Physically Abused: Patient declined    Sexually Abused: Patient declined     Current Outpatient Medications:    atenolol-chlorthalidone (TENORETIC) 50-25 MG tablet, Take 1 tablet by mouth daily., Disp: 90 tablet, Rfl: 2   atorvastatin (LIPITOR) 40 MG tablet, Take 1 tablet (40 mg total) by mouth daily., Disp: 90 tablet, Rfl: 3   benzonatate (TESSALON) 100 MG capsule, Take 2 capsules (200 mg total) by mouth 2 (two) times daily as needed for up to 10 days., Disp: 30 capsule, Rfl: 0   HYDROcodone bit-homatropine (HYCODAN) 5-1.5 MG/5ML syrup, Take 5 mLs by mouth every 12 (twelve) hours as needed for up to 10 days for cough., Disp: 100 mL, Rfl: 0   baclofen (LIORESAL) 10 MG tablet, Take 1 tablet (10 mg total) by mouth 2 (two) times daily as needed for muscle spasms. (Patient not taking: Reported on 11/23/2022), Disp: 30 each, Rfl: 1   meloxicam (MOBIC) 7.5 MG tablet, Take 1 tablet (7.5 mg total) by mouth daily. (Patient not taking: Reported on 11/23/2022), Disp: 7 tablet, Rfl: 0  EXAM:  VITALS per patient if applicable:Ht 5\' 4"  (1.626 m)   LMP  (LMP Unknown)   BMI 26.88 kg/m   GENERAL: alert, oriented, appears well and in no acute distress  HEENT: atraumatic, conjunctiva clear, no obvious abnormalities on inspection of external nose and ears  NECK: normal movements of the head and neck  LUNGS: on inspection no signs of respiratory distress, breathing rate appears normal, no obvious  gross SOB, gasping or wheezing Coughing a few times during visit.  CV: no obvious cyanosis  MS: moves all visible extremities without noticeable abnormality  PSYCH/NEURO: pleasant and cooperative, no obvious depression or anxiety, speech and thought processing grossly intact  ASSESSMENT AND PLAN:  Discussed the following assessment and plan:  Cough, unspecified type -     Benzonatate; Take 2 capsules (200 mg total) by mouth 2 (two) times daily as needed for up to 10 days.  Dispense: 30 capsule; Refill: 0 -     HYDROcodone Bit-Homatrop MBr; Take 5 mLs by mouth every 12 (twelve) hours as needed for up to 10 days for cough.  Dispense: 100 mL; Refill: 0  We discussed possible etiologies, most likely  residual symptoms from recent upper respiratory infection. She already completed treatment with azithromycin.  Explained that antibiotic does not help with cough. Recommend benzonatate and Hycodan for cough management, we discussed some side effects. Instructed to take Benzonatate during the day and Hycodan at bedtime.  Continue adequate hydration. Instructed to monitor for fever, dyspnea, or wheezing. If cough has not resolved in 2 weeks, she will let em know, so we can arrange for CXR. She was clearly instructed about warning signs.  We discussed possible serious and likely etiologies, options for evaluation and workup, limitations of telemedicine visit vs in person visit, treatment, treatment risks and precautions. The patient was advised to call back or seek an in-person evaluation if the symptoms worsen or if the condition fails to improve as anticipated. I discussed the assessment and treatment plan with the patient. The patient was provided an opportunity to ask questions and all were answered. The patient agreed with the plan and demonstrated an understanding of the instructions.  I, Rolla Etienne Wierda, acting as a scribe for Shalah Estelle [place], MD., have documented all relevant documentation on  the behalf of Sephiroth Mcluckie [place], MD, as directed by  Rosealie Reach [place], MD while in the presence of Kaydynce Pat [place], MD.   I, Camreigh Michie [place], MD, have reviewed all documentation for this visit. The documentation on 11/30/22 for the exam, diagnosis, procedures, and orders are all accurate and complete.  Return if symptoms worsen or fail to improve.  Michalene Debruler [place], MD

## 2022-11-30 NOTE — Telephone Encounter (Signed)
Pt is calling back and stated the Zpack didn't work for her and need dr.Fry to call her in something else call in also want a call back when called in.

## 2022-12-24 NOTE — Telephone Encounter (Signed)
error 

## 2022-12-28 NOTE — Progress Notes (Signed)
HPI: Alexis Stout is a 67 y.o. female with a PMHx significant for atherosclerosis of aorta, HTN, OA, HLD, prediabetes, and chronic neck pain, who is here today for chronic disease management.  Last seen on 10/02/2022  Hypertension:  Medications: Currently on atenolol-chlorthalidone 50-25 mg daily She has not been checking her BP regularly at home.  Side effects: none Negative for unusual or severe headache, visual changes, exertional chest pain, dyspnea, palpitations, focal weakness, or edema.  Lab Results  Component Value Date   CREATININE 0.68 08/26/2022   BUN 11 08/26/2022   NA 140 08/26/2022   K 4.1 08/26/2022   CL 107 08/26/2022   CO2 27 08/26/2022   Hyperlipidemia: Currently on atorvastatin 40 mg daily.  She is not trying to avoid fat in her diet.  Side effects from medication: none  Lab Results  Component Value Date   CHOL 283 (H) 07/31/2022   HDL 44.30 07/31/2022   LDLCALC 213 (H) 07/31/2022   LDLDIRECT 179.9 05/21/2009   TRIG 127.0 07/31/2022   CHOLHDL 6 07/31/2022   Prediabetes: Negative for polydipsia,polyuria, or polyphagia. HgA1C 6.3 in 07/2021.  Chronic neck pain:  Since her last visit she has seen orthopedist and had cervical MRI. Incidentally left thyroid nodule was seen.  She has discussed having cervical epidural injections for neck pain.  She is not currently taking baclofen 10 mg.   Cervical MRI from 11/21: Impression:  C3/C4 disc bulge and disc herniation cause spinal canal stenosis A moderate C4/C5 central disc herniation causes spinal canal stenosis. Bilateral foraminal stenosis.  C5/C6 disc bulge and disc herniation cause spinal canal stenosis. Bilateral foraminal stenosis.  C6/C7 disc bulge and disc herniation cause spinal canal stenosis. Bilateral foraminal stenosis.  Incidental thyroid findings as described: left thyroid nodule measuring approximately 1.2 cm."  Review of Systems  Constitutional:  Negative for activity change,  appetite change and fever.  HENT:  Negative for mouth sores, nosebleeds, sore throat and trouble swallowing.   Respiratory:  Negative for cough and wheezing.   Gastrointestinal:  Negative for abdominal pain, nausea and vomiting.       Negative for changes in bowel habits.  Endocrine: Negative for cold intolerance and heat intolerance.  Genitourinary:  Negative for decreased urine volume, dysuria and hematuria.  Musculoskeletal:  Positive for neck pain.  Skin:  Negative for rash.  Neurological:  Negative for syncope and facial asymmetry.  Psychiatric/Behavioral:  Negative for confusion and hallucinations.   See other pertinent positives and negatives in HPI.  Current Outpatient Medications on File Prior to Visit  Medication Sig Dispense Refill   atenolol-chlorthalidone (TENORETIC) 50-25 MG tablet Take 1 tablet by mouth daily. 90 tablet 2   atorvastatin (LIPITOR) 40 MG tablet Take 1 tablet (40 mg total) by mouth daily. 90 tablet 3   baclofen (LIORESAL) 10 MG tablet Take 1 tablet (10 mg total) by mouth 2 (two) times daily as needed for muscle spasms. (Patient not taking: Reported on 11/23/2022) 30 each 1   meloxicam (MOBIC) 7.5 MG tablet Take 1 tablet (7.5 mg total) by mouth daily. (Patient not taking: Reported on 11/23/2022) 7 tablet 0   No current facility-administered medications on file prior to visit.   Past Medical History:  Diagnosis Date   Arthritis    GERD (gastroesophageal reflux disease)    Hyperlipidemia    Hypertension    No Known Allergies  Social History   Socioeconomic History   Marital status: Divorced    Spouse name: Not on file  Number of children: 1   Years of education: Not on file   Highest education level: Not on file  Occupational History   Occupation: property mgmt  Tobacco Use   Smoking status: Never   Smokeless tobacco: Never  Vaping Use   Vaping status: Never Used  Substance and Sexual Activity   Alcohol use: Yes    Alcohol/week: 3.0 standard  drinks of alcohol    Types: 3 Glasses of wine per week   Drug use: Never   Sexual activity: Not on file    Comment: socially  Other Topics Concern   Not on file  Social History Narrative   Not on file   Social Drivers of Health   Financial Resource Strain: Low Risk  (04/24/2021)   Overall Financial Resource Strain (CARDIA)    Difficulty of Paying Living Expenses: Not hard at all  Food Insecurity: Patient Declined (11/27/2022)   Hunger Vital Sign    Worried About Running Out of Food in the Last Year: Patient declined    Ran Out of Food in the Last Year: Patient declined  Transportation Needs: Patient Declined (11/27/2022)   PRAPARE - Administrator, Civil Service (Medical): Patient declined    Lack of Transportation (Non-Medical): Patient declined  Physical Activity: Insufficiently Active (04/24/2021)   Exercise Vital Sign    Days of Exercise per Week: 1 day    Minutes of Exercise per Session: 30 min  Stress: No Stress Concern Present (04/24/2021)   [employer] of Occupational Health - Occupational Stress Questionnaire    Feeling of Stress : Not at all  Social Connections: Moderately Integrated (04/24/2021)   Social Connection and Isolation Panel [NHANES]    Frequency of Communication with Friends and Family: More than three times a week    Frequency of Social Gatherings with Friends and Family: More than three times a week    Attends Religious Services: More than 4 times per year    Active Member of [employer] or Organizations: Yes    Attends Engineer, structural: More than 4 times per year    Marital Status: Divorced   Today's Vitals   12/29/22 0827  BP: 128/70  Pulse: 76  Resp: 16  SpO2: 98%  Weight: 163 lb (73.9 kg)  Height: 5\' 4"  (1.626 m)   Body mass index is 27.98 kg/m.  Physical Exam Vitals and nursing note reviewed.  Constitutional:      General: She is not in acute distress.    Appearance: She is well-developed.  HENT:     Head:  Normocephalic and atraumatic.     Mouth/Throat:     Mouth: Mucous membranes are moist.     Pharynx: Oropharynx is clear.  Eyes:     Conjunctiva/sclera: Conjunctivae normal.  Cardiovascular:     Rate and Rhythm: Normal rate and regular rhythm.     Pulses:          Posterior tibial pulses are 2+ on the right side and 2+ on the left side.     Heart sounds: No murmur heard. Pulmonary:     Effort: Pulmonary effort is normal. No respiratory distress.     Breath sounds: Normal breath sounds.  Abdominal:     Palpations: Abdomen is soft. There is no mass.     Tenderness: There is no abdominal tenderness.  Musculoskeletal:     Right lower leg: No edema.     Left lower leg: No edema.  Lymphadenopathy:  Cervical: No cervical adenopathy.  Skin:    General: Skin is warm.     Findings: No erythema or rash.  Neurological:     General: No focal deficit present.     Mental Status: She is alert and oriented to person, place, and time.     Cranial Nerves: No cranial nerve deficit.     Gait: Gait normal.  Psychiatric:        Mood and Affect: Mood and affect normal.   ASSESSMENT AND PLAN:  Alexis Stout was seen today for chronic disease management.  Lab Results  Component Value Date   HGBA1C 6.7 (H) 12/29/2022   Lab Results  Component Value Date   TSH 0.40 12/29/2022   Lab Results  Component Value Date   CHOL 217 (H) 12/29/2022   HDL 50.00 12/29/2022   LDLCALC 135 (H) 12/29/2022   LDLDIRECT 179.9 05/21/2009   TRIG 162.0 (H) 12/29/2022   CHOLHDL 4 12/29/2022   Lab Results  Component Value Date   NA 141 12/29/2022   CL 104 12/29/2022   K 3.4 (L) 12/29/2022   CO2 26 12/29/2022   BUN 25 (H) 12/29/2022   CREATININE 0.88 12/29/2022   GFR 67.19 12/29/2022   CALCIUM 9.3 12/29/2022   ALBUMIN 3.7 08/14/2022   GLUCOSE 132 (H) 12/29/2022   Prediabetes Assessment & Plan: HgA1C 2 years ago was 6.5 but rest , last one 6.3. Encouraged consistency with a healthy life style for glucose  control. Further recommendations according to HgA1C result.  Orders: -     Hemoglobin A1c; Future  Thyroid nodule Assessment & Plan: Seen on cervical MRI. Thyroid US and TSH ordered today.  Orders: -     TSH; Future -     US THYROID; Future  Hyperlipidemia, mixed Assessment & Plan: Last LDL 213 in 07/2021. Continue Atorvastatin 40 mg daily. Low fat diet recommended. Further recommendations according to FLP results.  Orders: -     Lipid panel; Future  Essential hypertension Assessment & Plan: BP adequately controlled. Continue atenolol-chlorthalidone 50/25 mg daily and low salt diet. Monitor BP at home. F/U in 6 months.  Orders: -     Basic metabolic panel; Future  Return in about 6 months (around 06/29/2023).  I, Suanne Marker, acting as a scribe for Leeah Politano [place], MD., have documented all relevant documentation on the behalf of Alexis Nester [place], MD, as directed by  Alexis Flegal [place], MD while in the presence of Alexis Aicher [place], MD.   I, Suanne Marker, have reviewed all documentation for this visit. The documentation on 12/28/22 for the exam, diagnosis, procedures, and orders are all accurate and complete.  Alexis Siess G. [place], MD  [facility]. [place] office.

## 2022-12-29 ENCOUNTER — Ambulatory Visit (INDEPENDENT_AMBULATORY_CARE_PROVIDER_SITE_OTHER): Payer: No Typology Code available for payment source | Admitting: Family Medicine

## 2022-12-29 ENCOUNTER — Encounter: Payer: Self-pay | Admitting: Family Medicine

## 2022-12-29 VITALS — BP 128/70 | HR 76 | Resp 16 | Ht 64.0 in | Wt 163.0 lb

## 2022-12-29 DIAGNOSIS — E1169 Type 2 diabetes mellitus with other specified complication: Secondary | ICD-10-CM

## 2022-12-29 DIAGNOSIS — E782 Mixed hyperlipidemia: Secondary | ICD-10-CM | POA: Diagnosis not present

## 2022-12-29 DIAGNOSIS — E041 Nontoxic single thyroid nodule: Secondary | ICD-10-CM

## 2022-12-29 DIAGNOSIS — I10 Essential (primary) hypertension: Secondary | ICD-10-CM

## 2022-12-29 DIAGNOSIS — R7303 Prediabetes: Secondary | ICD-10-CM

## 2022-12-29 LAB — BASIC METABOLIC PANEL
BUN: 25 mg/dL — ABNORMAL HIGH (ref 6–23)
CO2: 26 meq/L (ref 19–32)
Calcium: 9.3 mg/dL (ref 8.4–10.5)
Chloride: 104 meq/L (ref 96–112)
Creatinine, Ser: 0.88 mg/dL (ref 0.40–1.20)
GFR: 67.19 mL/min (ref >60.00)
Glucose, Bld: 132 mg/dL — ABNORMAL HIGH (ref 70–99)
Potassium: 3.4 meq/L — ABNORMAL LOW (ref 3.5–5.1)
Sodium: 141 meq/L (ref 135–145)

## 2022-12-29 LAB — LIPID PANEL
Cholesterol: 217 mg/dL — ABNORMAL HIGH (ref 0–200)
HDL: 50 mg/dL (ref >39.00)
LDL Cholesterol: 135 mg/dL — ABNORMAL HIGH (ref 0–99)
NonHDL: 167.21
Total CHOL/HDL Ratio: 4
Triglycerides: 162 mg/dL — ABNORMAL HIGH (ref 0.0–149.0)
VLDL: 32.4 mg/dL (ref 0.0–40.0)

## 2022-12-29 LAB — HEMOGLOBIN A1C: Hgb A1c MFr Bld: 6.7 % — ABNORMAL HIGH (ref 4.6–6.5)

## 2022-12-29 LAB — TSH: TSH: 0.4 u[IU]/mL (ref 0.35–5.50)

## 2022-12-29 NOTE — Patient Instructions (Addendum)
A few things to remember from today's visit:  Thyroid nodule - Plan: TSH, US THYROID  Hyperlipidemia, mixed - Plan: Lipid panel  Essential hypertension - Plan: Basic metabolic panel  Prediabetes - Plan: Hemoglobin A1c  No changes today. Will arrange thyroid ultrasound to evaluate thyroid nodule seen on MRI.  If you need refills for medications you take chronically, please call your pharmacy. Do not use My Chart to request refills or for acute issues that need immediate attention. If you send a my chart message, it may take a few days to be addressed, specially if I am not in the office.  Please be sure medication list is accurate. If a new problem present, please set up appointment sooner than planned today.

## 2022-12-31 NOTE — Assessment & Plan Note (Signed)
Seen on cervical MRI. Thyroid US and TSH ordered today.

## 2022-12-31 NOTE — Assessment & Plan Note (Signed)
HgA1C 2 years ago was 6.5 but rest , last one 6.3. Encouraged consistency with a healthy life style for glucose control. Further recommendations according to HgA1C result.

## 2022-12-31 NOTE — Assessment & Plan Note (Signed)
BP adequately controlled. Continue atenolol-chlorthalidone 50/25 mg daily and low salt diet. Monitor BP at home. F/U in 6 months.

## 2022-12-31 NOTE — Assessment & Plan Note (Signed)
Last LDL 213 in 07/2021. Continue Atorvastatin 40 mg daily. Low fat diet recommended. Further recommendations according to FLP results.

## 2023-01-01 DIAGNOSIS — E119 Type 2 diabetes mellitus without complications: Secondary | ICD-10-CM | POA: Insufficient documentation

## 2023-01-01 MED ORDER — GLUCOSE BLOOD VI STRP
ORAL_STRIP | 2 refills | Status: AC
Start: 2023-01-01 — End: ?

## 2023-01-01 MED ORDER — ACCU-CHEK AVIVA PLUS W/DEVICE KIT
PACK | 0 refills | Status: AC
Start: 2023-01-01 — End: ?

## 2023-01-01 MED ORDER — ACCU-CHEK SOFTCLIX LANCETS MISC
2 refills | Status: AC
Start: 2023-01-01 — End: ?

## 2023-01-05 ENCOUNTER — Ambulatory Visit: Payer: No Typology Code available for payment source | Admitting: Family Medicine

## 2023-01-07 ENCOUNTER — Ambulatory Visit: Payer: No Typology Code available for payment source

## 2023-01-11 ENCOUNTER — Encounter: Payer: Self-pay | Admitting: *Deleted

## 2023-01-11 NOTE — Progress Notes (Signed)
 Pt attended 11/27/22 screening event where her b/p was 166/83 and then 159/90 on retake and her blood sugar was 152. At the event, the pt confirmed her PCP was Dr. Betty Stout at [facility] and noted she had insurance, did not smoke, and did not identify any SDOH insecurities. Chart review indicates pt was seen most recently by her PCP Dr. Jordan on 12/29/22, when her b/p was 128/70, and her blood sugar was 132, and her A1C was 6.7. PCP notes discuss DM control strategies with/for pt during this visit. Pt has future appt with Dr. Jordan on 04/02/23. No additional health equity team support indicated at this time.

## 2023-01-14 ENCOUNTER — Ambulatory Visit: Payer: No Typology Code available for payment source

## 2023-01-21 ENCOUNTER — Ambulatory Visit: Payer: No Typology Code available for payment source

## 2023-02-24 ENCOUNTER — Ambulatory Visit: Payer: No Typology Code available for payment source | Admitting: Skilled Nursing Facility1

## 2023-04-02 ENCOUNTER — Ambulatory Visit: Payer: No Typology Code available for payment source | Admitting: Family Medicine

## 2023-06-29 ENCOUNTER — Ambulatory Visit (INDEPENDENT_AMBULATORY_CARE_PROVIDER_SITE_OTHER): Payer: No Typology Code available for payment source | Admitting: Family Medicine

## 2023-06-29 ENCOUNTER — Encounter: Payer: Self-pay | Admitting: Family Medicine

## 2023-06-29 ENCOUNTER — Ambulatory Visit: Payer: Self-pay | Admitting: Family Medicine

## 2023-06-29 VITALS — BP 126/80 | HR 64 | Resp 16 | Ht 64.0 in | Wt 153.0 lb

## 2023-06-29 DIAGNOSIS — E1169 Type 2 diabetes mellitus with other specified complication: Secondary | ICD-10-CM | POA: Diagnosis not present

## 2023-06-29 DIAGNOSIS — E782 Mixed hyperlipidemia: Secondary | ICD-10-CM | POA: Diagnosis not present

## 2023-06-29 DIAGNOSIS — K625 Hemorrhage of anus and rectum: Secondary | ICD-10-CM | POA: Diagnosis not present

## 2023-06-29 DIAGNOSIS — I10 Essential (primary) hypertension: Secondary | ICD-10-CM

## 2023-06-29 DIAGNOSIS — E876 Hypokalemia: Secondary | ICD-10-CM | POA: Insufficient documentation

## 2023-06-29 LAB — POCT GLYCOSYLATED HEMOGLOBIN (HGB A1C): HbA1c, POC (prediabetic range): 6 % (ref 5.7–6.4)

## 2023-06-29 LAB — CBC
HCT: 42.6 % (ref 36.0–46.0)
Hemoglobin: 14 g/dL (ref 12.0–15.0)
MCHC: 32.9 g/dL (ref 30.0–36.0)
MCV: 77 fl — ABNORMAL LOW (ref 78.0–100.0)
Platelets: 214 10*3/uL (ref 150.0–400.0)
RBC: 5.53 Mil/uL — ABNORMAL HIGH (ref 3.87–5.11)
RDW: 13.4 % (ref 11.5–15.5)
WBC: 6.6 10*3/uL (ref 4.0–10.5)

## 2023-06-29 LAB — BASIC METABOLIC PANEL WITH GFR
BUN: 19 mg/dL (ref 6–23)
CO2: 31 meq/L (ref 19–32)
Calcium: 9.5 mg/dL (ref 8.4–10.5)
Chloride: 103 meq/L (ref 96–112)
Creatinine, Ser: 0.79 mg/dL (ref 0.40–1.20)
GFR: 76.22 mL/min (ref >60.00)
Glucose, Bld: 113 mg/dL — ABNORMAL HIGH (ref 70–99)
Potassium: 3.2 meq/L — ABNORMAL LOW (ref 3.5–5.1)
Sodium: 141 meq/L (ref 135–145)

## 2023-06-29 LAB — MICROALBUMIN / CREATININE URINE RATIO
Creatinine,U: 152.8 mg/dL
Microalb Creat Ratio: 5.9 mg/g (ref 0.0–30.0)
Microalb, Ur: 0.9 mg/dL (ref 0.0–1.9)

## 2023-06-29 LAB — MAGNESIUM: Magnesium: 2.5 mg/dL (ref 1.5–2.5)

## 2023-06-29 NOTE — Patient Instructions (Addendum)
 A few things to remember from today's visit:  Type 2 diabetes mellitus with other specified complication, without long-term current use of insulin (HCC) - Plan: POC HgB A1c, Microalbumin / creatinine urine ratio  Essential hypertension - Plan: Basic metabolic panel with GFR, CBC  Rectal bleed - Plan: Ambulatory referral to Gastroenterology, CBC  Hypokalemia - Plan: Magnesium Increase foods that have potassium.  If you need refills for medications you take chronically, please call your pharmacy. Do not use My Chart to request refills or for acute issues that need immediate attention. If you send a my chart message, it may take a few days to be addressed, specially if I am not in the office.  Please be sure medication list is accurate. If a new problem present, please set up appointment sooner than planned today.

## 2023-06-29 NOTE — Assessment & Plan Note (Signed)
 We discussed son side effects of diuretic therapy. For now continue potassium rich diet. Further recommendation will be given according to BMP result.

## 2023-06-29 NOTE — Assessment & Plan Note (Signed)
 BP adequately controlled. Continue atenolol -chlorthalidone  50/25 mg daily and low salt diet. We discussed some side effects. Monitor BP at home. F/U in 6 months.

## 2023-06-29 NOTE — Progress Notes (Signed)
 HPI: Alexis Stout is a 68 y.o. female with a PMHx significant for atherosclerosis of aorta, HTN, OA, HLD, prediabetes, and chronic neck pain, who is here today for chronic disease management.  Last seen on 12/29/22  Hypertension:  Medications: Atenolol -chlorthalidone  50-25 mg daily.  Good compliance and tolerance.  Negative for unusual or severe headache, visual changes, exertional chest pain, dyspnea,  focal weakness, or edema.  Lab Results  Component Value Date   CREATININE 0.88 12/29/2022   BUN 25 (H) 12/29/2022   NA 141 12/29/2022   K 3.4 (L) 12/29/2022   CL 104 12/29/2022   CO2 26 12/29/2022   Hyperlipidemia: Not currently compliant with Atorvastatin  40 mg daily.  She is only taking her statin about 2 times per week.  She notes she was more compliant in the past.  Overall healthy diet with smaller portion sizes.  Lab Results  Component Value Date   CHOL 217 (H) 12/29/2022   HDL 50.00 12/29/2022   LDLCALC 135 (H) 12/29/2022   LDLDIRECT 179.9 05/21/2009   TRIG 162.0 (H) 12/29/2022   CHOLHDL 4 12/29/2022   DM II:Dx'ed in 11/2020 with HgA1C 6.5. She also lost about 10 lbs.  She has been eating relatively healthier with smaller portions, although she notes she did not make drastic changes since her last visit.  She is not checking BS's. Negative for symptoms of hypoglycemia, polyuria, polydipsia, numbness extremities, foot ulcers/trauma. Lab Results  Component Value Date   HGBA1C 6.0 06/29/2023   Rectal bleeding: Pt complains of one episode of rectal bleeding and reports a remote hx of hemorrhoids. She has had intermittent constipation. Blood on tissue after straining. Negative for abdominal pain, nausea, dyschezia,or melena. She is due for colonoscopy later this year, 10/2023.   Lab Results  Component Value Date   TSH 0.40 12/29/2022   Colonoscopy -- done 10/25/2013 The examined terminal ileum appeared to be normal 2.   Pedunculated polyp (seen on CT)  was found in the sigmoid colon; polypectomy was performed using snare cautery 3.   The colonic mucosa appeared normal throughout the entire examined colon; multiple random biopsies were performed 4.  The mild diverticulois seen on CT is not appreciated. I am not convinced she has had divertculitis in retrospect.  Review of Systems  Constitutional:  Negative for activity change, appetite change and fever.  Respiratory:  Negative for cough and wheezing.   Gastrointestinal:  Negative for blood in stool and vomiting.  Endocrine: Negative for cold intolerance and heat intolerance.  Genitourinary:  Negative for decreased urine volume, dysuria and hematuria.  Skin:  Negative for rash.  Neurological:  Negative for syncope and facial asymmetry.  See other pertinent positives and negatives in HPI.  Current Outpatient Medications on File Prior to Visit  Medication Sig Dispense Refill   Accu-Chek Softclix Lancets lancets Use as instructed 100 each 2   atenolol -chlorthalidone  (TENORETIC ) 50-25 MG tablet Take 1 tablet by mouth daily. 90 tablet 2   atorvastatin  (LIPITOR) 40 MG tablet Take 1 tablet (40 mg total) by mouth daily. 90 tablet 3   baclofen  (LIORESAL ) 10 MG tablet Take 1 tablet (10 mg total) by mouth 2 (two) times daily as needed for muscle spasms. 30 each 1   Blood Glucose Monitoring Suppl (ACCU-CHEK AVIVA PLUS) w/Device KIT As directed. 1 kit 0   glucose blood test strip Use as instructed 100 each 2   meloxicam  (MOBIC ) 7.5 MG tablet Take 1 tablet (7.5 mg total) by mouth daily. 7 tablet  0   No current facility-administered medications on file prior to visit.    Past Medical History:  Diagnosis Date   Arthritis    GERD (gastroesophageal reflux disease)    Hyperlipidemia    Hypertension    No Known Allergies  Social History   Socioeconomic History   Marital status: Divorced    Spouse name: Not on file   Number of children: 1   Years of education: Not on file   Highest education  level: Not on file  Occupational History   Occupation: property mgmt  Tobacco Use   Smoking status: Never   Smokeless tobacco: Never  Vaping Use   Vaping status: Never Used  Substance and Sexual Activity   Alcohol use: Yes    Alcohol/week: 3.0 standard drinks of alcohol    Types: 3 Glasses of wine per week   Drug use: Never   Sexual activity: Not on file    Comment: socially  Other Topics Concern   Not on file  Social History Narrative   Not on file   Social Drivers of Health   Financial Resource Strain: Low Risk  (04/24/2021)   Overall Financial Resource Strain (CARDIA)    Difficulty of Paying Living Expenses: Not hard at all  Food Insecurity: Patient Declined (11/27/2022)   Hunger Vital Sign    Worried About Running Out of Food in the Last Year: Patient declined    Ran Out of Food in the Last Year: Patient declined  Transportation Needs: Patient Declined (11/27/2022)   PRAPARE - Administrator, Civil Service (Medical): Patient declined    Lack of Transportation (Non-Medical): Patient declined  Physical Activity: Insufficiently Active (04/24/2021)   Exercise Vital Sign    Days of Exercise per Week: 1 day    Minutes of Exercise per Session: 30 min  Stress: No Stress Concern Present (04/24/2021)   [employer] of Occupational Health - Occupational Stress Questionnaire    Feeling of Stress : Not at all  Social Connections: Moderately Integrated (04/24/2021)   Social Connection and Isolation Panel    Frequency of Communication with Friends and Family: More than three times a week    Frequency of Social Gatherings with Friends and Family: More than three times a week    Attends Religious Services: More than 4 times per year    Active Member of [employer] or Organizations: Yes    Attends Banker Meetings: More than 4 times per year    Marital Status: Divorced   Today's Vitals   06/29/23 0826  BP: 126/80  Pulse: 64  Resp: 16  SpO2: 99%  Weight: 153  lb (69.4 kg)  Height: 5' 4 (1.626 m)   Body mass index is 26.26 kg/m. Wt Readings from Last 3 Encounters:  06/29/23 153 lb (69.4 kg)  12/29/22 163 lb (73.9 kg)  11/23/22 156 lb 9.6 oz (71 kg)   Physical Exam Vitals and nursing note reviewed.  Constitutional:      General: She is not in acute distress.    Appearance: She is well-developed.  HENT:     Head: Normocephalic and atraumatic.     Mouth/Throat:     Mouth: Mucous membranes are moist.     Pharynx: Oropharynx is clear.   Eyes:     Conjunctiva/sclera: Conjunctivae normal.    Cardiovascular:     Rate and Rhythm: Normal rate and regular rhythm.     Pulses:  Posterior tibial pulses are 2+ on the right side and 2+ on the left side.     Heart sounds: No murmur heard. Pulmonary:     Effort: Pulmonary effort is normal. No respiratory distress.     Breath sounds: Normal breath sounds.  Abdominal:     Palpations: Abdomen is soft. There is no mass.     Tenderness: There is no abdominal tenderness.  Genitourinary:    Comments: Declined rectal exam, deferred to GI.  Musculoskeletal:     Right lower leg: No edema.     Left lower leg: No edema.  Lymphadenopathy:     Cervical: No cervical adenopathy.   Skin:    General: Skin is warm.     Findings: No erythema or rash.   Neurological:     General: No focal deficit present.     Mental Status: She is alert and oriented to person, place, and time.     Cranial Nerves: No cranial nerve deficit.     Gait: Gait normal.   Psychiatric:        Mood and Affect: Mood and affect normal.    ASSESSMENT AND PLAN: Alexis Stout was seen today for chronic disease management.  Orders Placed This Encounter  Procedures   POC HgB A1c   Lab Results  Component Value Date   HGBA1C 6.0 06/29/2023   Lab Results  Component Value Date   WBC 6.6 06/29/2023   HGB 14.0 06/29/2023   HCT 42.6 06/29/2023   MCV 77.0 (L) 06/29/2023   PLT 214.0 06/29/2023   Lab Results   Component Value Date   MICROALBUR 0.9 06/29/2023   Lab Results  Component Value Date   NA 141 06/29/2023   CL 103 06/29/2023   K 3.2 (L) 06/29/2023   CO2 31 06/29/2023   BUN 19 06/29/2023   CREATININE 0.79 06/29/2023   GFR 76.22 06/29/2023   CALCIUM  9.5 06/29/2023   ALBUMIN 3.7 08/14/2022   GLUCOSE 113 (H) 06/29/2023   Type 2 diabetes mellitus with other specified complication, without long-term current use of insulin (HCC) Assessment & Plan: HgA1C at goal, it went from 6.7 to 6.0 today. Continue nonpharmacologic treatment. Regular exercise and healthy diet with avoidance of added sugar food intake encouraged.   Annual eye exam, periodic dental and foot care recommended. F/U in 5-6 months.  Orders: -     POCT glycosylated hemoglobin (Hb A1C) -     Microalbumin / creatinine urine ratio; Future  Rectal bleed A single episode of blood on tissue after defecation, at that time she was having hard stool and straining, most likely hemorrhoidal related. She is going to be due for colonoscopy this year, so GI referral placed. Further recommendation will be given according to lab results. Instructed about warning signs.  -     Ambulatory referral to Gastroenterology -     CBC; Future  Hypokalemia Assessment & Plan: We discussed son side effects of diuretic therapy. For now continue potassium rich diet. Further recommendation will be given according to BMP result.  Orders: -     Magnesium; Future  Essential hypertension Assessment & Plan: BP adequately controlled. Continue atenolol -chlorthalidone  50/25 mg daily and low salt diet. We discussed some side effects. Monitor BP at home. F/U in 6 months.  Orders: -     Basic metabolic panel with GFR; Future -     CBC; Future  Hyperlipidemia, mixed Assessment & Plan: Last LDL far from goal, 135 in 87/7975. She  is taking atorvastatin  40 mg about twice per week, recommend taking medication daily. We discussed CV benefits of  statin as well as LDL goals. Will plan on fasting lipid panel at next visit.  Return in about 6 months (around 12/29/2023) for chronic problems, CPE.  I, Vernell Forest, acting as a scribe for Alexis Reine [place], MD., have documented all relevant documentation on the behalf of Alexis Hewett [place], MD, as directed by   while in the presence of Terisa Belardo [place], MD.  I, Kedarius Aloisi [place], MD, have reviewed all documentation for this visit. The documentation on 06/29/23 for the exam, diagnosis, procedures, and orders are all accurate and complete.   Garrus Gauthreaux G. [place], MD  [facility] [place] office.

## 2023-06-29 NOTE — Assessment & Plan Note (Signed)
 Last LDL far from goal, 135 in 87/7975. She is taking atorvastatin  40 mg about twice per week, recommend taking medication daily. We discussed CV benefits of statin as well as LDL goals. Will plan on fasting lipid panel at next visit.

## 2023-06-29 NOTE — Assessment & Plan Note (Signed)
 HgA1C at goal, it went from 6.7 to 6.0 today. Continue nonpharmacologic treatment. Regular exercise and healthy diet with avoidance of added sugar food intake encouraged.   Annual eye exam, periodic dental and foot care recommended. F/U in 5-6 months.

## 2023-06-30 ENCOUNTER — Other Ambulatory Visit: Payer: Self-pay

## 2023-06-30 MED ORDER — POTASSIUM CHLORIDE CRYS ER 20 MEQ PO TBCR: 20.0000 meq | EXTENDED_RELEASE_TABLET | Freq: Every day | ORAL | 3 refills | Status: AC

## 2023-07-20 ENCOUNTER — Ambulatory Visit (INDEPENDENT_AMBULATORY_CARE_PROVIDER_SITE_OTHER): Admitting: Family Medicine

## 2023-07-20 DIAGNOSIS — Z0000 Encounter for general adult medical examination without abnormal findings: Secondary | ICD-10-CM

## 2023-07-20 DIAGNOSIS — E2839 Other primary ovarian failure: Secondary | ICD-10-CM

## 2023-07-20 NOTE — Progress Notes (Signed)
 PATIENT CHECK-IN and HEALTH RISK ASSESSMENT QUESTIONNAIRE:  -completed by phone/video for upcoming Medicare Preventive Visit  -Please select NOT IN PERSON for method of visit.  Pre-Visit Check-in: 1)Vitals (height, wt, BP, etc) - record in vitals section for visit on day of visit Request home vitals (wt, BP, etc.) and enter into vitals, THEN update Vital Signs SmartPhrase below at the top of the HPI. See below.  2)Review and Update Medications, Allergies PMH, Surgeries, Social history in Epic 3)Hospitalizations in the last year with date/reason? n  4)Review and Update Care Team (patient's specialists) in Epic 5) Complete PHQ9 in Epic  6) Complete Fall Screening in Epic 7)Review all Health Maintenance Due and order under PCP if not done.  Medicare Wellness Patient Questionnaire:  Answer theses question about your habits: How often do you have a drink containing alcohol?rarely How many drinks containing alcohol do you have on a typical day when you are drinking? 1 glass How often do you have six or more drinks on one occasion?n Have you ever smoked?n Quit date if applicable? na  How many packs a day do/did you smoke? na Do you use smokeless tobacco?na Do you use an illicit drugs?na On average, how many days per week do you engage in moderate to strenuous exercise (like a brisk walk)?active, painting, climbing ladders - very physical On average, how many minutes do you engage in exercise at this level?na - active all the time Typical breakfast: no breakfast today, varies Typical lunch: fired chicken Typical dinner: chicken, potato salad, cabbage Typical snacks:bad of potato  Beverages: lemonade  Answer theses question about your everyday activities: Can you perform most household chores? Are you deaf or have significant trouble hearing?n Do you feel that you have a problem with memory?n Do you feel safe at home?y Last dentist visit? Goes on regular basis 8. Do you have any  difficulty performing your everyday activities?n Are you having any difficulty walking, taking medications on your own, and or difficulty managing daily home needs?n Do you have difficulty walking or climbing stairs?n Do you have difficulty dressing or bathing?n Do you have difficulty doing errands alone such as visiting a doctor's office or shopping?n Do you currently have any difficulty preparing food and eating?n Do you currently have any difficulty using the toilet?n Do you have any difficulty managing your finances?n Do you have any difficulties with housekeeping of managing your housekeeping?n   Do you have Advanced Directives in place (Living Will, Healthcare Power or Attorney)? n   Last eye Exam and location? Plans to schedule, admits needs to do this   Do you currently use prescribed or non-prescribed narcotic or opioid pain medications?   Do you have a history or close family history of breast, ovarian, tubal or peritoneal cancer or a family member with BRCA (breast cancer susceptibility 1 and 2) gene mutations?     ----------------------------------------------------------------------------------------------------------------------------------------------------------------------------------------------------------------------  Because this visit was a virtual/telehealth visit, some criteria may be missing or patient reported. Any vitals not documented were not able to be obtained and vitals that have been documented are patient reported.    MEDICARE ANNUAL PREVENTIVE CARE VISIT WITH PROVIDER (Welcome to Medicare, initial annual wellness or annual wellness exam)  Virtual Visit via Video Note  I connected with Alexis Stout on 07/20/23  by a video enabled telemedicine application and verified that I am speaking with the correct person using two identifiers.  Location patient: car Location provider:work or home office Persons participating in the virtual visit: patient,  provider  Concerns and/or follow up today: no concerns, doing well.    See HM section in Epic for other details of completed HM.    ROS: negative for report of fevers, unintentional weight loss, vision changes, vision loss, hearing loss or change, chest pain, sob, hemoptysis, melena, hematochezia, hematuria, falls, bleeding or bruising, thoughts of suicide or self harm, memory loss  Patient-completed extensive health risk assessment - reviewed and discussed with the patient: See Health Risk Assessment completed with patient prior to the visit either above or in recent phone note. This was reviewed in detailed with the patient today and appropriate recommendations, orders and referrals were placed as needed per Summary below and patient instructions.   Review of Medical History: -PMH, PSH, Family History and current specialty and care providers reviewed and updated and listed below   Patient Care Team: [place], Betty G, MD as PCP - General (Family Medicine) [place], Betty G, MD as PCP - Family Medicine (Family Medicine)   Past Medical History:  Diagnosis Date   Arthritis    GERD (gastroesophageal reflux disease)    Hyperlipidemia    Hypertension     Past Surgical History:  Procedure Laterality Date   ABDOMINAL HYSTERECTOMY     COLONOSCOPY  last 10/25/2013   POLYPECTOMY     ROTATOR CUFF REPAIR Right     Social History   Socioeconomic History   Marital status: Divorced    Spouse name: Not on file   Number of children: 1   Years of education: Not on file   Highest education level: Not on file  Occupational History   Occupation: property mgmt  Tobacco Use   Smoking status: Never   Smokeless tobacco: Never  Vaping Use   Vaping status: Never Used  Substance and Sexual Activity   Alcohol use: Yes    Alcohol/week: 3.0 standard drinks of alcohol    Types: 3 Glasses of wine per week   Drug use: Never   Sexual activity: Not on file    Comment: socially  Other Topics  Concern   Not on file  Social History Narrative   Not on file   Social Drivers of Health   Financial Resource Strain: Low Risk  (04/24/2021)   Overall Financial Resource Strain (CARDIA)    Difficulty of Paying Living Expenses: Not hard at all  Food Insecurity: Patient Declined (11/27/2022)   Hunger Vital Sign    Worried About Running Out of Food in the Last Year: Patient declined    Ran Out of Food in the Last Year: Patient declined  Transportation Needs: Patient Declined (11/27/2022)   PRAPARE - Administrator, Civil Service (Medical): Patient declined    Lack of Transportation (Non-Medical): Patient declined  Physical Activity: Insufficiently Active (04/24/2021)   Exercise Vital Sign    Days of Exercise per Week: 1 day    Minutes of Exercise per Session: 30 min  Stress: No Stress Concern Present (04/24/2021)   [employer] of Occupational Health - Occupational Stress Questionnaire    Feeling of Stress : Not at all  Social Connections: Moderately Integrated (04/24/2021)   Social Connection and Isolation Panel    Frequency of Communication with Friends and Family: More than three times a week    Frequency of Social Gatherings with Friends and Family: More than three times a week    Attends Religious Services: More than 4 times per year    Active Member of [employer] or Organizations: Yes  Attends Banker Meetings: More than 4 times per year    Marital Status: Divorced  Intimate Partner Violence: Patient Declined (11/27/2022)   Humiliation, Afraid, Rape, and Kick questionnaire    Fear of Current or Ex-Partner: Patient declined    Emotionally Abused: Patient declined    Physically Abused: Patient declined    Sexually Abused: Patient declined    Family History  Problem Relation Age of Onset   Ovarian cancer Mother    Heart attack Father    Breast cancer Sister 5   Diabetes Sister        x 3   Colon cancer Neg Hx    Esophageal cancer Neg Hx     Rectal cancer Neg Hx    Stomach cancer Neg Hx    Colon polyps Neg Hx     Current Outpatient Medications on File Prior to Visit  Medication Sig Dispense Refill   Accu-Chek Softclix Lancets lancets Use as instructed 100 each 2   atenolol -chlorthalidone  (TENORETIC ) 50-25 MG tablet Take 1 tablet by mouth daily. 90 tablet 2   atorvastatin  (LIPITOR) 40 MG tablet Take 1 tablet (40 mg total) by mouth daily. 90 tablet 3   baclofen  (LIORESAL ) 10 MG tablet Take 1 tablet (10 mg total) by mouth 2 (two) times daily as needed for muscle spasms. 30 each 1   Blood Glucose Monitoring Suppl (ACCU-CHEK AVIVA PLUS) w/Device KIT As directed. 1 kit 0   glucose blood test strip Use as instructed 100 each 2   meloxicam  (MOBIC ) 7.5 MG tablet Take 1 tablet (7.5 mg total) by mouth daily. 7 tablet 0   potassium chloride  SA (KLOR-CON  M) 20 MEQ tablet Take 1 tablet (20 mEq total) by mouth daily. 90 tablet 3   No current facility-administered medications on file prior to visit.    No Known Allergies     Physical Exam Vitals requested from patient and listed below if patient had equipment and was able to obtain at home for this virtual visit: There were no vitals filed for this visit. Estimated body mass index is 26.26 kg/m as calculated from the following:   Height as of 06/29/23: 5' 4 (1.626 m).   Weight as of 06/29/23: 153 lb (69.4 kg).  EKG (optional): deferred due to virtual visit  GENERAL: alert, oriented, no acute distress detected; full vision exam deferred due to pandemic and/or virtual encounter  HEENT: atraumatic, conjunttiva clear, no obvious abnormalities on inspection of external nose and ears  NECK: normal movements of the head and neck  LUNGS: on inspection no signs of respiratory distress, breathing rate appears normal, no obvious gross SOB, gasping or wheezing  CV: no obvious cyanosis  MS: moves all visible extremities without noticeable abnormality  PSYCH/NEURO: pleasant and  cooperative, no obvious depression or anxiety, speech and thought processing grossly intact, Cognitive function grossly intact  Flowsheet Row Clinical Support from 12/20/2019 in [facility]  PHQ-9 Total Score 0        07/20/2023    4:45 PM 07/31/2022    7:56 AM 04/24/2021    8:52 AM 11/26/2020    9:12 AM 12/20/2019    8:40 AM  Depression screen PHQ 2/9  Decreased Interest 0 0 0 0 0  Down, Depressed, Hopeless 0 0 0 0 0  PHQ - 2 Score 0 0 0 0 0  Altered sleeping     0  Tired, decreased energy     0  Change in appetite  0  Feeling bad or failure about yourself      0  Trouble concentrating     0  Moving slowly or fidgety/restless     0  Suicidal thoughts     0  PHQ-9 Score     0  Difficult doing work/chores     Not difficult at all       11/26/2020    8:27 PM 04/24/2021    8:54 AM 11/08/2021   11:43 AM 07/31/2022    7:56 AM 07/20/2023    4:45 PM  Fall Risk  Falls in the past year? 0 0  0 0  Was there an injury with Fall? 0 0  0 0  Fall Risk Category Calculator 0 0  0 0  Fall Risk Category (Retired) Low  Low      (RETIRED) Patient Fall Risk Level Low fall risk  Low fall risk  Low fall risk     Patient at Risk for Falls Due to  No Fall Risks  Other (Comment)   Fall risk Follow up Education provided    Falls evaluation completed Falls evaluation completed     Data saved with a previous flowsheet row definition     SUMMARY AND PLAN:  Encounter for Medicare annual wellness exam  Estrogen deficiency - Plan: DG Bone Density   Discussed applicable health maintenance/preventive health measures and advised and referred or ordered per patient preferences: -advised to schedule eye exam and discussed options -discussed dexa, order placed -she is due for colonoscopy, provided number and advised she call to schedule Health Maintenance  Topic Date Due   OPHTHALMOLOGY EXAM  Never done   DEXA SCAN  Never done   Pneumococcal Vaccine: 50+ Years (1  of 2 - PCV) 08/01/2023 (Originally 11/14/1974)   COVID-19 Vaccine (1 - 2024-25 season) 08/05/2023 (Originally 09/06/2022)   Colonoscopy  08/27/2023 (Originally 10/27/2016)   DTaP/Tdap/Td (3 - Tdap) 07/19/2024 (Originally 05/22/2019)   Zoster Vaccines- Shingrix (1 of 2) 01/10/2026 (Originally 11/15/2003)   INFLUENZA VACCINE  08/06/2023   HEMOGLOBIN A1C  12/29/2023   MAMMOGRAM  04/07/2024   Diabetic kidney evaluation - eGFR measurement  06/28/2024   Diabetic kidney evaluation - Urine ACR  06/28/2024   FOOT EXAM  06/28/2024   Medicare Annual Wellness (AWV)  07/19/2024   Hepatitis C Screening  Completed   Hepatitis B Vaccines  Aged Out   HPV VACCINES  Aged Out   Meningococcal B Vaccine  Aged Out     Education and counseling on the following was provided based on the above review of health and a plan/checklist for the patient, along with additional information discussed, was provided for the patient in the patient instructions :  -Advised on importance of completing advanced directives, discussed options for completing and provided information in patient instructions as well -Advised and counseled on a healthy lifestyle  -Reviewed patient's current diet. Advised and counseled on a whole foods based healthy diet. A summary of a healthy diet was provided in the Patient Instructions.  -reviewed patient's current physical activity level and discussed exercise guidelines for adults. Discussed community resources and ideas for safe exercise at home to assist in meeting exercise guideline recommendations in a safe and healthy way.  -Advise yearly dental visits at minimum and regular eye exams   Follow up: see patient instructions   Patient Instructions  I really enjoyed getting to talk with you today! I am available on Tuesdays and Thursdays for virtual visits if you have  any questions or concerns, or if I can be of any further assistance.   CHECKLIST FROM ANNUAL WELLNESS VISIT:  -Follow up  (please call to schedule if not scheduled after visit):   -yearly for annual wellness visit with primary care office  Here is a list of your preventive care/health maintenance measures and the plan for each if any are due:  PLAN For any measures below that may be due:    1. Please call ASAP to schedule your colonoscopy: [phone]   2. We placed an order for the bone density test.  Health Maintenance  Topic Date Due   OPHTHALMOLOGY EXAM  Never done   DEXA SCAN  Never done   Pneumococcal Vaccine: 50+ Years (1 of 2 - PCV) 08/01/2023 (Originally 11/14/1974)   COVID-19 Vaccine (1 - 2024-25 season) 08/05/2023 (Originally 09/06/2022)   Colonoscopy  08/27/2023 (Originally 10/27/2016)   DTaP/Tdap/Td (3 - Tdap) 07/19/2024 (Originally 05/22/2019)   Zoster Vaccines- Shingrix (1 of 2) 01/10/2026 (Originally 11/15/2003)   INFLUENZA VACCINE  08/06/2023   HEMOGLOBIN A1C  12/29/2023   MAMMOGRAM  04/07/2024   Diabetic kidney evaluation - eGFR measurement  06/28/2024   Diabetic kidney evaluation - Urine ACR  06/28/2024   FOOT EXAM  06/28/2024   Medicare Annual Wellness (AWV)  07/19/2024   Hepatitis C Screening  Completed   Hepatitis B Vaccines  Aged Out   HPV VACCINES  Aged Out   Meningococcal B Vaccine  Aged Out    -See a dentist at least yearly  -Get your eyes checked and then per your eye specialist's recommendations  -Other issues addressed today:   -I have included below further information regarding a healthy whole foods based diet, physical activity guidelines for adults, stress management and opportunities for social connections. I hope you find this information useful.   -----------------------------------------------------------------------------------------------------------------------------------------------------------------------------------------------------------------------------------------------------------    NUTRITION: -eat real food: lots of colorful vegetables  (half the plate) and fruits -5-7 servings of vegetables and fruits per day (fresh or steamed is best), exp. 2 servings of vegetables with lunch and dinner and 2 servings of fruit per day. Berries and greens such as kale and collards are great choices.  -consume on a regular basis:  fresh fruits, fresh veggies, fish, nuts, seeds, healthy oils (such as olive oil, avocado oil), whole grains (make sure for bread/pasta/crackers/etc., that the first ingredient on label contains the word whole), legumes. -can eat small amounts of dairy and lean meat (no larger than the palm of your hand), but avoid processed meats such as ham, bacon, lunch meat, etc. -drink water -try to avoid fast food and pre-packaged foods, processed meat, ultra processed foods/beverages (donuts, candy, etc.) -most experts advise limiting sodium to < 2300mg  per day, should limit further is any chronic conditions such as high blood pressure, heart disease, diabetes, etc. The American Heart Association advised that < 1500mg  is is ideal -try to avoid foods/beverages that contain any ingredients with names you do not recognize  -try to avoid foods/beverages  with added sugar or sweeteners/sweets  -try to avoid sweet drinks (including diet drinks): soda, juice, Gatorade, sweet tea, power drinks, diet drinks -try to avoid white rice, white bread, pasta (unless whole grain)  EXERCISE GUIDELINES FOR ADULTS: -if you wish to increase your physical activity, do so gradually and with the approval of your doctor -STOP and seek medical care immediately if you have any chest pain, chest discomfort or trouble breathing when starting or increasing exercise  -move and stretch your body,  legs, feet and arms when sitting for long periods -Physical activity guidelines for optimal health in adults: -get at least 150 minutes per week of moderate exercise (can talk, but not sing); this is about 20-30 minutes of sustained activity 5-7 days per week or two  10-15 minute episodes of sustained activity 5-7 days per week -do some muscle building/resistance training/strength training at least 2 days per week  -balance exercises 3+ days per week:   Stand somewhere where you have something sturdy to hold onto if you lose balance    1) lift up on toes, then back down, start with 5x per day and work up to 20x   2) stand and lift one leg straight out to the side so that foot is a few inches of the floor, start with 5x each side and work up to 20x each side   3) stand on one foot, start with 5 seconds each side and work up to 20 seconds on each side  If you need ideas or help with getting more active:  -Silver  sneakers https://tools.silversneakers.com  -Walk with a Doc: http://www.duncan-williams.com/  -try to include resistance (weight lifting/strength building) and balance exercises twice per week: or the following link for ideas: http://castillo-powell.com/  BuyDucts.dk  STRESS MANAGEMENT: -can try meditating, or just sitting quietly with deep breathing while intentionally relaxing all parts of your body for 5 minutes daily -if you need further help with stress, anxiety or depression please follow up with your primary doctor or contact the wonderful folks at [facility]: [phone]  SOCIAL CONNECTIONS: -options in [place] if you wish to engage in more social and exercise related activities:  -Silver  sneakers https://tools.silversneakers.com  -Walk with a Doc: http://www.duncan-williams.com/  -Check out the [facility] Active Adults 50+ section on the [place] [employer] (hiking clubs, book clubs, cards and games, chess, exercise classes, aquatic classes and much more) - see the website for details: https://www.[place].gov/departments/parks-recreation/active-adults50  -YouTube has lots of exercise videos for different ages and  abilities as well  -[facility] (a variety of indoor and outdoor inperson activities for adults). 3147046741. [address]  -Virtual Online Classes (a variety of topics): see seniorplanet.org or call [phone]  -consider volunteering at a school, hospice center, church, senior center or elsewhere  ADVANCED HEALTHCARE DIRECTIVES:  [place] Advanced Directives assistance:   ExpressWeek.com.cy  Everyone should have advanced health care directives in place. This is so that you get the care you want, should you ever be in a situation where you are unable to make your own medical decisions.   From the  Advanced Directive Website: Advance Health Care Directives are legal documents in which you give written instructions about your health care if, in the future, you cannot speak for yourself.   A health care power of attorney allows you to name a person you trust to make your health care decisions if you cannot make them yourself. A declaration of a desire for a natural death (or living will) is document, which states that you desire not to have your life prolonged by extraordinary measures if you have a terminal or incurable illness or if you are in a vegetative state. An advance instruction for mental health treatment makes a declaration of instructions, information and preferences regarding your mental health treatment. It also states that you are aware that the advance instruction authorizes a mental health treatment provider to act according to your wishes. It may also outline your consent or refusal of mental health  treatment. A declaration of an anatomical gift allows anyone over the age of 41 to make a gift by will, organ donor card or other document.   Please see the following website or an elder law attorney for forms, FAQs and for completion of advanced directives: [place]  Print production planner  Health Care Directives Advance Health Care Directives (http://guzman.com/)  Or copy and paste the following to your web browser: [email or web address]    Alexis JONELLE Cramp, DO

## 2023-07-20 NOTE — Patient Instructions (Signed)
 I really enjoyed getting to talk with you today! I am available on Tuesdays and Thursdays for virtual visits if you have any questions or concerns, or if I can be of any further assistance.   CHECKLIST FROM ANNUAL WELLNESS VISIT:  -Follow up (please call to schedule if not scheduled after visit):   -yearly for annual wellness visit with primary care office  Here is a list of your preventive care/health maintenance measures and the plan for each if any are due:  PLAN For any measures below that may be due:    1. Please call ASAP to schedule your colonoscopy: [phone]   2. We placed an order for the bone density test.  Health Maintenance  Topic Date Due   OPHTHALMOLOGY EXAM  Never done   DEXA SCAN  Never done   Pneumococcal Vaccine: 50+ Years (1 of 2 - PCV) 08/01/2023 (Originally 11/14/1974)   COVID-19 Vaccine (1 - 2024-25 season) 08/05/2023 (Originally 09/06/2022)   Colonoscopy  08/27/2023 (Originally 10/27/2016)   DTaP/Tdap/Td (3 - Tdap) 07/19/2024 (Originally 05/22/2019)   Zoster Vaccines- Shingrix (1 of 2) 01/10/2026 (Originally 11/15/2003)   INFLUENZA VACCINE  08/06/2023   HEMOGLOBIN A1C  12/29/2023   MAMMOGRAM  04/07/2024   Diabetic kidney evaluation - eGFR measurement  06/28/2024   Diabetic kidney evaluation - Urine ACR  06/28/2024   FOOT EXAM  06/28/2024   Medicare Annual Wellness (AWV)  07/19/2024   Hepatitis C Screening  Completed   Hepatitis B Vaccines  Aged Out   HPV VACCINES  Aged Out   Meningococcal B Vaccine  Aged Out    -See a dentist at least yearly  -Get your eyes checked and then per your eye specialist's recommendations  -Other issues addressed today:   -I have included below further information regarding a healthy whole foods based diet, physical activity guidelines for adults, stress management and opportunities for social connections. I hope you find this information useful.    -----------------------------------------------------------------------------------------------------------------------------------------------------------------------------------------------------------------------------------------------------------    NUTRITION: -eat real food: lots of colorful vegetables (half the plate) and fruits -5-7 servings of vegetables and fruits per day (fresh or steamed is best), exp. 2 servings of vegetables with lunch and dinner and 2 servings of fruit per day. Berries and greens such as kale and collards are great choices.  -consume on a regular basis:  fresh fruits, fresh veggies, fish, nuts, seeds, healthy oils (such as olive oil, avocado oil), whole grains (make sure for bread/pasta/crackers/etc., that the first ingredient on label contains the word whole), legumes. -can eat small amounts of dairy and lean meat (no larger than the palm of your hand), but avoid processed meats such as ham, bacon, lunch meat, etc. -drink water -try to avoid fast food and pre-packaged foods, processed meat, ultra processed foods/beverages (donuts, candy, etc.) -most experts advise limiting sodium to < 2300mg  per day, should limit further is any chronic conditions such as high blood pressure, heart disease, diabetes, etc. The American Heart Association advised that < 1500mg  is is ideal -try to avoid foods/beverages that contain any ingredients with names you do not recognize  -try to avoid foods/beverages  with added sugar or sweeteners/sweets  -try to avoid sweet drinks (including diet drinks): soda, juice, Gatorade, sweet tea, power drinks, diet drinks -try to avoid white rice, white bread, pasta (unless whole grain)  EXERCISE GUIDELINES FOR ADULTS: -if you wish to increase your physical activity, do so gradually and with the approval of your doctor -STOP and seek medical care immediately if  you have any chest pain, chest discomfort or trouble breathing when starting or  increasing exercise  -move and stretch your body, legs, feet and arms when sitting for long periods -Physical activity guidelines for optimal health in adults: -get at least 150 minutes per week of moderate exercise (can talk, but not sing); this is about 20-30 minutes of sustained activity 5-7 days per week or two 10-15 minute episodes of sustained activity 5-7 days per week -do some muscle building/resistance training/strength training at least 2 days per week  -balance exercises 3+ days per week:   Stand somewhere where you have something sturdy to hold onto if you lose balance    1) lift up on toes, then back down, start with 5x per day and work up to 20x   2) stand and lift one leg straight out to the side so that foot is a few inches of the floor, start with 5x each side and work up to 20x each side   3) stand on one foot, start with 5 seconds each side and work up to 20 seconds on each side  If you need ideas or help with getting more active:  -Silver  sneakers https://tools.silversneakers.com  -Walk with a Doc: http://www.duncan-williams.com/  -try to include resistance (weight lifting/strength building) and balance exercises twice per week: or the following link for ideas: http://castillo-powell.com/  BuyDucts.dk  STRESS MANAGEMENT: -can try meditating, or just sitting quietly with deep breathing while intentionally relaxing all parts of your body for 5 minutes daily -if you need further help with stress, anxiety or depression please follow up with your primary doctor or contact the wonderful folks at [facility]: [phone]  SOCIAL CONNECTIONS: -options in [place] if you wish to engage in more social and exercise related activities:  -Silver  sneakers https://tools.silversneakers.com  -Walk with a Doc: http://www.duncan-williams.com/  -Check out the [facility] Active Adults 50+  section on the [place] of [employer] (hiking clubs, book clubs, cards and games, chess, exercise classes, aquatic classes and much more) - see the website for details: https://www.[place].gov/departments/parks-recreation/active-adults50  -YouTube has lots of exercise videos for different ages and abilities as well  -[facility] (a variety of indoor and outdoor inperson activities for adults). [phone]. [address].  -Virtual Online Classes (a variety of topics): see seniorplanet.org or call [phone]  -consider volunteering at a school, hospice center, church, senior center or elsewhere  ADVANCED HEALTHCARE DIRECTIVES:  [place] Advanced Directives assistance:   ExpressWeek.com.cy  Everyone should have advanced health care directives in place. This is so that you get the care you want, should you ever be in a situation where you are unable to make your own medical decisions.   From the [place] Advanced Directive Website: Advance Health Care Directives are legal documents in which you give written instructions about your health care if, in the future, you cannot speak for yourself.   A health care power of attorney allows you to name a person you trust to make your health care decisions if you cannot make them yourself. A declaration of a desire for a natural death (or living will) is document, which states that you desire not to have your life prolonged by extraordinary measures if you have a terminal or incurable illness or if you are in a vegetative state. An advance instruction for mental health treatment makes a declaration of instructions, information and preferences regarding your mental health treatment. It also states that you are aware that the advance instruction authorizes a  mental health treatment provider to act according to your wishes. It may also outline your consent or refusal of mental  health treatment. A declaration of an anatomical gift allows anyone over the age of 13 to make a gift by will, organ donor card or other document.   Please see the following website or an elder law attorney for forms, FAQs and for completion of advanced directives: [place]  Print production planner Health Care Directives Advance Health Care Directives (http://guzman.com/)  Or copy and paste the following to your web browser: PoshChat.[email or web address]

## 2023-08-17 ENCOUNTER — Telehealth: Payer: Self-pay | Admitting: Family Medicine

## 2023-08-17 NOTE — Telephone Encounter (Signed)
-----   Message from [name] sent at 07/20/2023  6:01 PM EDT ----- Regarding: colonosocpy - pt to call, dexa AWV 7/15

## 2023-08-17 NOTE — Telephone Encounter (Signed)
 Called spoke with patient. Advised again of colonoscopy due and asked if needed any assistance in scheduling. Reports has been too busy. Offered to provide her number to call. She requested that I send through [place] as she was driving. Advised to let us  know if any issues scheduling or if needs anything further from us  regarding this.

## 2023-08-31 ENCOUNTER — Encounter: Payer: Self-pay | Admitting: Family Medicine

## 2023-09-01 ENCOUNTER — Encounter: Payer: Self-pay | Admitting: Gastroenterology

## 2023-10-08 ENCOUNTER — Other Ambulatory Visit: Payer: Self-pay | Admitting: Family Medicine

## 2023-10-08 DIAGNOSIS — Z1231 Encounter for screening mammogram for malignant neoplasm of breast: Secondary | ICD-10-CM

## 2023-10-20 ENCOUNTER — Ambulatory Visit
Admission: RE | Admit: 2023-10-20 | Discharge: 2023-10-20 | Disposition: A | Discharge status: Home / Self Care | Source: Ambulatory Visit | Attending: Family Medicine | Admitting: Family Medicine

## 2023-10-20 DIAGNOSIS — Z1231 Encounter for screening mammogram for malignant neoplasm of breast: Secondary | ICD-10-CM | POA: Diagnosis not present

## 2023-10-22 ENCOUNTER — Encounter: Payer: Self-pay | Admitting: Gastroenterology

## 2023-10-22 ENCOUNTER — Ambulatory Visit (INDEPENDENT_AMBULATORY_CARE_PROVIDER_SITE_OTHER): Admitting: Gastroenterology

## 2023-10-22 VITALS — BP 146/76 | HR 80 | Ht 64.0 in | Wt 157.2 lb

## 2023-10-22 DIAGNOSIS — Z800 Family history of malignant neoplasm of digestive organs: Secondary | ICD-10-CM | POA: Insufficient documentation

## 2023-10-22 DIAGNOSIS — Z860100 Personal history of colon polyps, unspecified: Secondary | ICD-10-CM | POA: Diagnosis not present

## 2023-10-22 DIAGNOSIS — Z1211 Encounter for screening for malignant neoplasm of colon: Secondary | ICD-10-CM | POA: Diagnosis not present

## 2023-10-22 MED ORDER — NA SULFATE-K SULFATE-MG SULF 17.5-3.13-1.6 GM/177ML PO SOLN: 1.0000 | Freq: Once | ORAL | 0 refills | Status: AC

## 2023-10-22 NOTE — Progress Notes (Signed)
 10/22/2023 Alexis Stout [id number] 1955/11/16  Discussed the use of AI scribe software for clinical note transcription with the patient, who gave verbal consent to proceed.  History of Present Illness Alexis Stout is a 68 year old female who presents for a colonoscopy.  She is here to discuss scheduling a colonoscopy, as her last procedure was in 2015, during which one polyp was removed, a TA, and it was recommended that she have another in 3 years. She has not experienced significant episodes of rectal bleeding, except for a single episode associated with constipation during the summer, which she does not find concerning.  She reports regular bowel movements and does not require any medication to aid in bowel movements, stating she goes 'most mornings.' Her current medications include cholesterol and blood pressure medications.  Family history is significant for colon cancer in her brother, diagnosed in his late 35s.    Colonoscopy 10/2013:  ENDOSCOPIC IMPRESSION: 1. The examined terminal ileum appeared to be normal 2. Pedunculated polyp ( seen on CT) was found in the sigmoid colon; polypectomy was performed using snare cautery 3. The colonic mucosa appeared normal throughout the entire examined colon; multiple random biopsies were performed 4. The mild diverticulois seen on CT is not appreciated. I am not convinced she has had divertculitis in retrospect.  1. Surgical [P], random sites, biopsy - UNREMARKABLE COLONIC MUCOSA. NO MICROSCOPIC COLITIS, ACTIVE INFLAMMATION OR GRANULOMAS. 2. Surgical [P], sigmoid, polyp - TUBULAR ADENOMA; STALK MARGIN NOT INVOLVED BY ADENOMA. NO HIGH GRADE DYSPLASIA OR MALIGNANCY IDENTIFIED.  Past Medical History:  Diagnosis Date   Arthritis    GERD (gastroesophageal reflux disease)    Hyperlipidemia    Hypertension    Past Surgical History:  Procedure Laterality Date   ABDOMINAL HYSTERECTOMY     COLONOSCOPY  last 10/25/2013    POLYPECTOMY     ROTATOR CUFF REPAIR Right     reports that she has never smoked. She has never used smokeless tobacco. She reports current alcohol use of about 3.0 standard drinks of alcohol per week. She reports that she does not use drugs. family history includes Breast cancer (age of onset: 41) in her sister; Colon cancer in her brother; Diabetes in her sister; Heart attack in her father; Ovarian cancer in her mother. No Known Allergies    Outpatient Encounter Medications as of 10/22/2023  Medication Sig   atenolol -chlorthalidone  (TENORETIC ) 50-25 MG tablet Take 1 tablet by mouth daily.   atorvastatin  (LIPITOR) 40 MG tablet Take 1 tablet (40 mg total) by mouth daily.   meloxicam  (MOBIC ) 7.5 MG tablet Take 1 tablet (7.5 mg total) by mouth daily.   Na Sulfate-K Sulfate-Mg Sulfate concentrate (SUPREP) 17.5-3.13-1.6 GM/177ML SOLN Take 1 kit (354 mLs total) by mouth once for 1 dose.   potassium chloride  SA (KLOR-CON  M) 20 MEQ tablet Take 1 tablet (20 mEq total) by mouth daily.   Accu-Chek Softclix Lancets lancets Use as instructed (Patient not taking: Reported on 10/22/2023)   Blood Glucose Monitoring Suppl (ACCU-CHEK AVIVA PLUS) w/Device KIT As directed. (Patient not taking: Reported on 10/22/2023)   glucose blood test strip Use as instructed (Patient not taking: Reported on 10/22/2023)   [DISCONTINUED] baclofen  (LIORESAL ) 10 MG tablet Take 1 tablet (10 mg total) by mouth 2 (two) times daily as needed for muscle spasms.   No facility-administered encounter medications on file as of 10/22/2023.     REVIEW OF SYSTEMS  : All other systems reviewed and negative except  where noted in the History of Present Illness.   PHYSICAL EXAM: BP (!) 146/76 (BP Location: Right Arm, Patient Position: Sitting, Cuff Size: Normal)   Pulse 80   Ht 5' 4 (1.626 m)   Wt 157 lb 4 oz (71.3 kg)   LMP  (LMP Unknown)   BMI 26.99 kg/m  General: Well developed AA female in no acute distress Head:  Normocephalic and atraumatic Eyes:  Sclerae anicteric, conjunctiva pink. Ears: Normal auditory acuity Lungs: Clear throughout to auscultation; no W/R/R. Heart: Regular rate and rhythm; no M/R/G. Rectal: Will be done at the time of colonoscopy. Musculoskeletal: Symmetrical with no gross deformities  Skin: No lesions on visible extremities Extremities: No edema  Neurological: Alert oriented x 4, grossly non-focal Psychological:  Alert and cooperative. Normal mood and affect Assessment & Plan Colorectal cancer screening surveillance in patient with personal history of colon polyps and family history of colon cancer in her brother Colorectal cancer screening is due as the last colonoscopy was in 2015. She had a polyp removed during that procedure that was a TA and repeat was recommended in 3 years. There is a family history of colon cancer in her brother, diagnosed over age 2. She reports no current rectal bleeding or significant bowel issues, and her bowel movements are regular.  - Scheduled colonoscopy with Dr. Avram for surveillance.  The risks, benefits, and alternatives to colonoscopy were discussed with the patient and she consents to proceed.       CC:  [place], Betty G, MD

## 2023-10-22 NOTE — Patient Instructions (Signed)
 You have been scheduled for a colonoscopy. Please follow written instructions given to you at your visit today.   If you use inhalers (even only as needed), please bring them with you on the day of your procedure.  DO NOT TAKE 7 DAYS PRIOR TO TEST- Trulicity (dulaglutide) Ozempic, Wegovy (semaglutide) Mounjaro (tirzepatide) Bydureon Bcise (exanatide extended release)  DO NOT TAKE 1 DAY PRIOR TO YOUR TEST Rybelsus (semaglutide) Adlyxin (lixisenatide) Victoza (liraglutide) Byetta (exanatide) ___________________________________________________________________________

## 2023-11-05 ENCOUNTER — Other Ambulatory Visit: Payer: Self-pay | Admitting: Family Medicine

## 2023-11-05 DIAGNOSIS — I10 Essential (primary) hypertension: Secondary | ICD-10-CM

## 2023-11-09 ENCOUNTER — Ambulatory Visit: Payer: Self-pay

## 2023-11-09 ENCOUNTER — Ambulatory Visit: Admitting: Adult Health

## 2023-11-09 ENCOUNTER — Ambulatory Visit: Admitting: Family Medicine

## 2023-11-09 VITALS — BP 160/80 | Temp 98.1°F | Ht 64.0 in | Wt 156.0 lb

## 2023-11-09 DIAGNOSIS — S638X2A Sprain of other part of left wrist and hand, initial encounter: Secondary | ICD-10-CM | POA: Diagnosis not present

## 2023-11-09 DIAGNOSIS — I10 Essential (primary) hypertension: Secondary | ICD-10-CM

## 2023-11-09 MED ORDER — KETOROLAC TROMETHAMINE 60 MG/2ML IM SOLN
60.0000 mg | Freq: Once | INTRAMUSCULAR | Status: AC
  Administered 2023-11-09: 60 mg via INTRAMUSCULAR

## 2023-11-09 NOTE — Patient Instructions (Addendum)
 It was great meeting you today   We gave you a shot of an anti inflammatory medication called Toradol  in the office today. Starting tomorrow you can take Motrin or Aleve  for the next few days. Rest and ice as much as possible.   Schedule an xray for Friday

## 2023-11-09 NOTE — Progress Notes (Signed)
 Subjective:    Patient ID: Alexis Stout, female    DOB: [birth year], 68 y.o.   MRN: [id number]  HPI  Discussed the use of AI scribe software for clinical note transcription with the patient, who gave verbal consent to proceed.  History of Present Illness   Alexis Stout is a 68 year old female who presents with left wrist pain.  She has experienced severe left wrist pain for about a week. The pain is intense and worsens with wrist movements, especially turning. It feels like cutting and can be debilitating. A brace has been used without significant relief, and a topical spray caused burning. She has not tried ice or heat therapy. There is no history of recent trauma, falls, or bruising.  Her past occupation as a hairdresser involved extensive hand use, which she suspects may have contributed to her wrist issues. She also has some discomfort in her right wrist, though it is less severe.   She has not taken any oral pain medications for her wrist pain.       Review of Systems See HPI   Past Medical History:  Diagnosis Date   Arthritis    GERD (gastroesophageal reflux disease)    Hyperlipidemia    Hypertension     Social History   Socioeconomic History   Marital status: Divorced    Spouse name: Not on file   Number of children: 1   Years of education: Not on file   Highest education level: Not on file  Occupational History   Occupation: property mgmt  Tobacco Use   Smoking status: Never   Smokeless tobacco: Never  Vaping Use   Vaping status: Never Used  Substance and Sexual Activity   Alcohol use: Yes    Alcohol/week: 3.0 standard drinks of alcohol    Types: 3 Glasses of wine per week   Drug use: Never   Sexual activity: Not on file    Comment: socially  Other Topics Concern   Not on file  Social History Narrative   Not on file   Social Drivers of Health   Financial Resource Strain: Low Risk  (04/24/2021)   Overall Financial Resource Strain  (CARDIA)    Difficulty of Paying Living Expenses: Not hard at all  Food Insecurity: Patient Declined (11/27/2022)   Hunger Vital Sign    Worried About Running Out of Food in the Last Year: Patient declined    Ran Out of Food in the Last Year: Patient declined  Transportation Needs: Patient Declined (11/27/2022)   PRAPARE - Administrator, Civil Service (Medical): Patient declined    Lack of Transportation (Non-Medical): Patient declined  Physical Activity: Insufficiently Active (04/24/2021)   Exercise Vital Sign    Days of Exercise per Week: 1 day    Minutes of Exercise per Session: 30 min  Stress: No Stress Concern Present (04/24/2021)   [employer] of Occupational Health - Occupational Stress Questionnaire    Feeling of Stress : Not at all  Social Connections: Moderately Integrated (04/24/2021)   Social Connection and Isolation Panel    Frequency of Communication with Friends and Family: More than three times a week    Frequency of Social Gatherings with Friends and Family: More than three times a week    Attends Religious Services: More than 4 times per year    Active Member of [employer] or Organizations: Yes    Attends Banker Meetings: More than 4 times per year  Marital Status: Divorced  Catering Manager Violence: Patient Declined (11/27/2022)   Humiliation, Afraid, Rape, and Kick questionnaire    Fear of Current or Ex-Partner: Patient declined    Emotionally Abused: Patient declined    Physically Abused: Patient declined    Sexually Abused: Patient declined    Past Surgical History:  Procedure Laterality Date   ABDOMINAL HYSTERECTOMY     COLONOSCOPY  last 10/25/2013   POLYPECTOMY     ROTATOR CUFF REPAIR Right     Family History  Problem Relation Age of Onset   Ovarian cancer Mother    Heart attack Father    Breast cancer Sister 62   Diabetes Sister        x 3   Colon cancer Brother    Esophageal cancer Neg Hx    Rectal cancer Neg Hx     Stomach cancer Neg Hx    Colon polyps Neg Hx     No Known Allergies  Current Outpatient Medications on File Prior to Visit  Medication Sig Dispense Refill   Accu-Chek Softclix Lancets lancets Use as instructed 100 each 2   atenolol -chlorthalidone  (TENORETIC ) 50-25 MG tablet TAKE 1 TABLET BY MOUTH EVERY DAY 90 tablet 3   atorvastatin  (LIPITOR) 40 MG tablet Take 1 tablet (40 mg total) by mouth daily. 90 tablet 3   Blood Glucose Monitoring Suppl (ACCU-CHEK AVIVA PLUS) w/Device KIT As directed. 1 kit 0   glucose blood test strip Use as instructed 100 each 2   potassium chloride  SA (KLOR-CON  M) 20 MEQ tablet Take 1 tablet (20 mEq total) by mouth daily. 90 tablet 3   No current facility-administered medications on file prior to visit.    BP (!) 160/80   Temp 98.1 F (36.7 C) (Oral)   Ht 5' 4 (1.626 m)   Wt 156 lb (70.8 kg)   LMP  (LMP Unknown)   BMI 26.78 kg/m       Objective:   Physical Exam Vitals and nursing note reviewed.  Constitutional:      Appearance: Normal appearance.  Musculoskeletal:        General: Tenderness present. Normal range of motion.     Left wrist: Tenderness present. No swelling, bony tenderness, snuff box tenderness or crepitus. Normal range of motion. Normal pulse.     Comments: Pain with palpation along Carpometacarpal joint  Skin:    General: Skin is warm and dry.  Neurological:     General: No focal deficit present.     Mental Status: She is alert and oriented to person, place, and time.  Psychiatric:        Mood and Affect: Mood normal.        Behavior: Behavior normal.        Thought Content: Thought content normal.        Judgment: Judgment normal.        Assessment & Plan:  1. Sprain of carpometacarpal joint of left hand, initial encounter (Primary) - Will give Toradol  injection in the office today  - Advised not to take any more NSAIDS today but can start tomorrow - Ice and rest. Will do xray and follow up  - ketorolac  (TORADOL )  injection 60 mg - DG Wrist Complete Left; Future  2. Essential hypertension - Elevated in the office. Will have her keep an eye on it at home and follow up with PCP if not at goal   Darleene Shape, NP

## 2023-11-09 NOTE — Telephone Encounter (Signed)
 FYI Only or Action Required?: FYI only for provider: appointment scheduled on 11/09/2023.  Patient was last seen in primary care on 07/20/2023 by Luke Chiquita SAUNDERS, DO.  Called Nurse Triage reporting Wrist Pain.  Symptoms began a week ago.  Interventions attempted: Other: Using a spray and topical spray.  Symptoms are: gradually worsening.  Triage Disposition: See HCP Within 4 Hours (Or PCP Triage)  Patient/caregiver understands and will follow disposition?: Yes         Copied from CRM #[id number]. Topic: Clinical - Red Word Triage >> Nov 09, 2023  8:42 AM Vena HERO wrote: Red Word that prompted transfer to Nurse Triage: severe left wrist pain and also having pain in right wrist as well from painting Reason for Disposition  [1] SEVERE pain (e.g., excruciating, unable to use wrist at all) AND [2] not improved after 2 hours of pain medicine  Answer Assessment - Initial Assessment Questions Wearing a brace and using an otc spray on area for pain. Denies swelling in area.   1. ONSET: When did the pain start?     Last week  2. LOCATION: Where is the pain located?     Bilateral L worst than R wrist  3. PAIN: How bad is the pain? (Scale 1-10; or mild, moderate, severe)     10 4. WORK OR EXERCISE: Has there been any recent work or exercise that involved this part (i.e., hand or wrist) of the body?     Was painting last week  5. CAUSE: What do you think is causing the pain?     Overuse  6. OTHER SYMPTOMS: Do you have any other symptoms? (e.g., fever, neck pain, numbness or tingling, rash, swelling)     Numbness and tingling  Protocols used: Wrist Pain-A-AH

## 2023-11-12 ENCOUNTER — Other Ambulatory Visit

## 2023-11-12 ENCOUNTER — Ambulatory Visit

## 2023-11-12 DIAGNOSIS — S638X2A Sprain of other part of left wrist and hand, initial encounter: Secondary | ICD-10-CM

## 2023-11-15 ENCOUNTER — Other Ambulatory Visit

## 2023-11-17 ENCOUNTER — Ambulatory Visit: Payer: Self-pay | Admitting: Adult Health

## 2023-11-18 ENCOUNTER — Telehealth: Payer: Self-pay | Admitting: *Deleted

## 2023-11-18 NOTE — Telephone Encounter (Signed)
 Copied from CRM [id number]. Topic: Clinical - Lab/Test Results >> Nov 17, 2023  4:56 PM Hadassah PARAS wrote: Reason for CRM: Pt is returning a missed call from Vicci Marjorie SAUNDERS, CMA regarding results. Please advice #[phone]

## 2023-11-23 ENCOUNTER — Telehealth: Payer: Self-pay

## 2023-11-23 NOTE — Telephone Encounter (Signed)
 Copied from CRM [id number]. Topic: Referral - Question >> Nov 23, 2023 12:02 PM Alfonso ORN wrote: Reason for CRM: Reason for CRM: pt called back to get clarification on copay for referral . Relayed note says $495 copay under insurance verification . Pt requesting callback to see if this is needed at time of service and if referral can be changed Callback: [phone]

## 2023-11-25 ENCOUNTER — Ambulatory Visit (AMBULATORY_SURGERY_CENTER): Admitting: Internal Medicine

## 2023-11-25 ENCOUNTER — Encounter: Payer: Self-pay | Admitting: Internal Medicine

## 2023-11-25 VITALS — BP 163/78 | HR 57 | Temp 97.7°F | Resp 15 | Ht 64.0 in | Wt 157.0 lb

## 2023-11-25 DIAGNOSIS — K635 Polyp of colon: Secondary | ICD-10-CM

## 2023-11-25 DIAGNOSIS — Z860101 Personal history of adenomatous and serrated colon polyps: Secondary | ICD-10-CM

## 2023-11-25 DIAGNOSIS — D122 Benign neoplasm of ascending colon: Secondary | ICD-10-CM | POA: Diagnosis not present

## 2023-11-25 DIAGNOSIS — Z1211 Encounter for screening for malignant neoplasm of colon: Secondary | ICD-10-CM

## 2023-11-25 DIAGNOSIS — D125 Benign neoplasm of sigmoid colon: Secondary | ICD-10-CM

## 2023-11-25 DIAGNOSIS — K5730 Diverticulosis of large intestine without perforation or abscess without bleeding: Secondary | ICD-10-CM

## 2023-11-25 DIAGNOSIS — D127 Benign neoplasm of rectosigmoid junction: Secondary | ICD-10-CM

## 2023-11-25 DIAGNOSIS — Z860100 Personal history of colon polyps, unspecified: Secondary | ICD-10-CM

## 2023-11-25 MED ORDER — SODIUM CHLORIDE 0.9 % IV SOLN: 500.0000 mL | Freq: Once | INTRAVENOUS | Status: DC

## 2023-11-25 NOTE — Progress Notes (Signed)
 [name] History and Physical   Primary Care Physician:  Jordan, Betty G, MD   Reason for Procedure:    Encounter Diagnosis  Name Primary?   History of colonic polyps Yes     Plan:    Colonoscopy   The patient was provided an opportunity to ask questions and all were answered. The patient agreed with the plan.   HPI: Alexis Stout is a 68 y.o. female status post removal of a 20 mm pedunculated adenoma from the sigmoid colon in 2015.  She also had random biopsies at that time which were normal.  Surveillance colonoscopy recommended for 3 years.  She has not had a surveillance colonoscopy since.  A brother had colon cancer diagnosed in his late 43s.  Self   Past Medical History:  Diagnosis Date   Arthritis    GERD (gastroesophageal reflux disease)    Hyperlipidemia    Hypertension     Past Surgical History:  Procedure Laterality Date   ABDOMINAL HYSTERECTOMY     COLONOSCOPY  last 10/25/2013   POLYPECTOMY     ROTATOR CUFF REPAIR Right      Current Outpatient Medications  Medication Sig Dispense Refill   Accu-Chek Softclix Lancets lancets Use as instructed 100 each 2   atenolol -chlorthalidone  (TENORETIC ) 50-25 MG tablet TAKE 1 TABLET BY MOUTH EVERY DAY 90 tablet 3   atorvastatin  (LIPITOR) 40 MG tablet Take 1 tablet (40 mg total) by mouth daily. 90 tablet 3   Blood Glucose Monitoring Suppl (ACCU-CHEK AVIVA PLUS) w/Device KIT As directed. 1 kit 0   glucose blood test strip Use as instructed 100 each 2   potassium chloride  SA (KLOR-CON  M) 20 MEQ tablet Take 1 tablet (20 mEq total) by mouth daily. 90 tablet 3   No current facility-administered medications for this visit.    Allergies as of 11/25/2023   (No Known Allergies)    Family History  Problem Relation Age of Onset   Ovarian cancer Mother    Heart attack Father    Breast cancer Sister 1   Diabetes Sister        x 3   Colon cancer Brother    Esophageal cancer Neg Hx    Rectal cancer Neg  Hx    Stomach cancer Neg Hx    Colon polyps Neg Hx     Social History   Socioeconomic History   Marital status: Divorced    Spouse name: Not on file   Number of children: 1   Years of education: Not on file   Highest education level: Not on file  Occupational History   Occupation: property mgmt  Tobacco Use   Smoking status: Never   Smokeless tobacco: Never  Vaping Use   Vaping status: Never Used  Substance and Sexual Activity   Alcohol use: Yes    Alcohol/week: 3.0 standard drinks of alcohol    Types: 3 Glasses of wine per week   Drug use: Never   Sexual activity: Not on file    Comment: socially  Other Topics Concern   Not on file  Social History Narrative   Not on file   Social Drivers of Health   Financial Resource Strain: Low Risk  (04/24/2021)   Overall Financial Resource Strain (CARDIA)    Difficulty of Paying Living Expenses: Not hard at all  Food Insecurity: Patient Declined (11/27/2022)   Hunger Vital Sign    Worried About Running Out of Food in the Last Year: Patient declined  Ran Out of Food in the Last Year: Patient declined  Transportation Needs: Patient Declined (11/27/2022)   PRAPARE - Administrator, Civil Service (Medical): Patient declined    Lack of Transportation (Non-Medical): Patient declined  Physical Activity: Insufficiently Active (04/24/2021)   Exercise Vital Sign    Days of Exercise per Week: 1 day    Minutes of Exercise per Session: 30 min  Stress: No Stress Concern Present (04/24/2021)   [employer] of Occupational Health - Occupational Stress Questionnaire    Feeling of Stress : Not at all  Social Connections: Moderately Integrated (04/24/2021)   Social Connection and Isolation Panel    Frequency of Communication with Friends and Family: More than three times a week    Frequency of Social Gatherings with Friends and Family: More than three times a week    Attends Religious Services: More than 4 times per year     Active Member of [employer] or Organizations: Yes    Attends Engineer, Structural: More than 4 times per year    Marital Status: Divorced  Intimate Partner Violence: Patient Declined (11/27/2022)   Humiliation, Afraid, Rape, and Kick questionnaire    Fear of Current or Ex-Partner: Patient declined    Emotionally Abused: Patient declined    Physically Abused: Patient declined    Sexually Abused: Patient declined    Review of Systems: All other review of systems negative except as mentioned in the HPI.  Physical Exam: Vital signs Temp 97.7 F (36.5 C)   LMP  (LMP Unknown)   General:   Alert,  Well-developed, well-nourished, pleasant and cooperative in NAD Lungs:  Clear throughout to auscultation.   Heart:  Regular rate and rhythm; no murmurs, clicks, rubs,  or gallops. Abdomen:  Soft, nontender and nondistended. Normal bowel sounds.   Neuro/Psych:  Alert and cooperative. Normal mood and affect. A and O x 3   @Janea [email or web address]  [name], MD, [facility] [phone] (pager) 11/25/2023 9:51 AM@

## 2023-11-25 NOTE — Patient Instructions (Addendum)
 I found and removed 4 polyps today.  They all look benign but could be precancerous.  They will be analyzed and I will let you know what they were and when to repeat a routine colonoscopy.  I also saw some diverticulosis which are little pockets in the colon, quite common and not typically a problem.  Please read the handout.  I also saw hemorrhoids.  I appreciate the opportunity care for you. Lupita [name], MD, FACG   YOU HAD AN ENDOSCOPIC PROCEDURE TODAY AT THE [facility]:   Refer to the procedure report that was given to you for any specific questions about what was found during the examination.  If the procedure report does not answer your questions, please call your gastroenterologist to clarify.  If you requested that your care partner not be given the details of your procedure findings, then the procedure report has been included in a sealed envelope for you to review at your convenience later.  YOU SHOULD EXPECT: Some feelings of bloating in the abdomen. Passage of more gas than usual.  Walking can help get rid of the air that was put into your GI tract during the procedure and reduce the bloating. If you had a lower endoscopy (such as a colonoscopy or flexible sigmoidoscopy) you may notice spotting of blood in your stool or on the toilet paper. If you underwent a bowel prep for your procedure, you may not have a normal bowel movement for a few days.  Please Note:  You might notice some irritation and congestion in your nose or some drainage.  This is from the oxygen used during your procedure.  There is no need for concern and it should clear up in a day or so.  SYMPTOMS TO REPORT IMMEDIATELY:  Following lower endoscopy (colonoscopy or flexible sigmoidoscopy):  Excessive amounts of blood in the stool  Significant tenderness or worsening of abdominal pains  Swelling of the abdomen that is new, acute  Fever of 100F or higher  For urgent or emergent issues, a  gastroenterologist can be reached at any hour by calling [phone]. Do not use MyChart messaging for urgent concerns.    DIET:  We do recommend a small meal at first, but then you may proceed to your regular diet.  Drink plenty of fluids but you should avoid alcoholic beverages for 24 hours.  ACTIVITY:  You should plan to take it easy for the rest of today and you should NOT DRIVE or use heavy machinery until tomorrow (because of the sedation medicines used during the test).    FOLLOW UP: Our staff will call the number listed on your records the next business day following your procedure.  We will call around 7:15- 8:00 am to check on you and address any questions or concerns that you may have regarding the information given to you following your procedure. If we do not reach you, we will leave a message.     If any biopsies were taken you will be contacted by phone or by letter within the next 1-3 weeks.  Please call us  at [phone] if you have not heard about the biopsies in 3 weeks.    SIGNATURES/CONFIDENTIALITY: You and/or your care partner have signed paperwork which will be entered into your electronic medical record.  These signatures attest to the fact that that the information above on your After Visit Summary has been reviewed and is understood.  Full responsibility of the confidentiality of this discharge  information lies with you and/or your care-partner.

## 2023-11-25 NOTE — Progress Notes (Signed)
 Sedate, gd SR, tolerated procedure well, VSS, report to RN

## 2023-11-25 NOTE — Op Note (Signed)
  [facility] Patient Name: Alexis Stout Procedure Date: 11/25/2023 9:50 AM MRN: [id number] Endoscopist: Lupita FORBES Commander , MD, [id number] Age: 68 Referring MD:  Date of Birth: 10-28-1955 Gender: Female Account #: [id number] Procedure:                Colonoscopy Indications:              Surveillance: Personal history of adenomatous                            polyps on last colonoscopy > 5 years ago, Last                            colonoscopy: 2015 Medicines:                Monitored Anesthesia Care Procedure:                Pre-Anesthesia Assessment:                           - Prior to the procedure, a History and Physical                            was performed, and patient medications and                            allergies were reviewed. The patient's tolerance of                            previous anesthesia was also reviewed. The risks                            and benefits of the procedure and the sedation                            options and risks were discussed with the patient.                            All questions were answered, and informed consent                            was obtained. Prior Anticoagulants: The patient has                            taken no anticoagulant or antiplatelet agents. ASA                            Grade Assessment: II - A patient with mild systemic                            disease. After reviewing the risks and benefits,                            the patient was deemed in satisfactory condition to  undergo the procedure.                           After obtaining informed consent, the colonoscope                            was passed under direct vision. Throughout the                            procedure, the patient's blood pressure, pulse, and                            oxygen saturations were monitored continuously. The                            Olympus Scope SN: [id number] was introduced  through                            the anus and advanced to the the cecum, identified                            by appendiceal orifice and ileocecal valve. The                            colonoscopy was performed without difficulty. The                            patient tolerated the procedure well. The quality                            of the bowel preparation was adequate. The                            ileocecal valve, appendiceal orifice, and rectum                            were photographed. The bowel preparation used was                            SUPREP via split dose instruction. Scope In: 10:17:17 AM Scope Out: 10:34:20 AM Scope Withdrawal Time: 0 hours 15 minutes 24 seconds  Total Procedure Duration: 0 hours 17 minutes 3 seconds  Findings:                 Hemorrhoids were found on perianal exam and on                            retroflexion in rectum.                           Four sessile and semi-pedunculated polyps were                            found in the recto-sigmoid colon, sigmoid colon and  cecum. The polyps were 3 to 10 mm in size. These                            polyps were removed with a cold snare. Resection                            and retrieval were complete. Verification of                            patient identification for the specimen was done.                            Estimated blood loss was minimal.                           Scattered small-mouthed diverticula were found in                            the sigmoid colon, descending colon and cecum.                           The exam was otherwise without abnormality on                            direct and retroflexion views. Complications:            No immediate complications. Estimated Blood Loss:     Estimated blood loss was minimal. Impression:               - External hemorrhoids.                           - Four 3 to 10 mm polyps at the recto-sigmoid                             colon, in the sigmoid colon and in the cecum,                            removed with a cold snare. Resected and retrieved.                           - Diverticulosis in the sigmoid colon, in the                            descending colon and in the cecum.                           - The examination was otherwise normal on direct                            and retroflexion views.                           - Personal history of colonic polyp - 20 mm  pedunculated adenoma removed at last colonoscopy in                            2015. Recommendation:           - Patient has a contact number available for                            emergencies. The signs and symptoms of potential                            delayed complications were discussed with the                            patient. Return to normal activities tomorrow.                            Written discharge instructions were provided to the                            patient.                           - Resume previous diet.                           - Continue present medications.                           - Repeat colonoscopy is recommended for                            surveillance. The colonoscopy date will be                            determined after pathology results from today's                            exam become available for review. Lupita FORBES Commander, MD 11/25/2023 10:43:32 AM This report has been signed electronically.

## 2023-11-26 ENCOUNTER — Telehealth: Payer: Self-pay

## 2023-11-26 NOTE — Telephone Encounter (Signed)
  Follow up Call-     11/25/2023    9:48 AM  Call back number  Post procedure Call Back phone  # [phone]  Permission to leave phone message Yes     Patient questions:  Do you have a fever, pain , or abdominal swelling? No. Pain Score  0 *  Have you tolerated food without any problems? Yes.    Have you been able to return to your normal activities? Yes.    Do you have any questions about your discharge instructions: Diet   No. Medications  No. Follow up visit  No.  Do you have questions or concerns about your Care? No.  Actions: * If pain score is 4 or above: No action needed, pain <4.

## 2023-11-29 LAB — SURGICAL PATHOLOGY

## 2023-12-07 ENCOUNTER — Ambulatory Visit: Payer: Self-pay | Admitting: Internal Medicine

## 2024-01-10 ENCOUNTER — Ambulatory Visit: Admitting: Family Medicine
